# Patient Record
Sex: Female | Born: 1975 | Race: White | Hispanic: No | Marital: Married | Smoking: Never smoker
Health system: Southern US, Community
[De-identification: ages and names within clinical notes are randomized; demographics above are authoritative.]

## PROBLEM LIST (undated history)

## (undated) DIAGNOSIS — M1990 Unspecified osteoarthritis, unspecified site: Secondary | ICD-10-CM

## (undated) DIAGNOSIS — M722 Plantar fascial fibromatosis: Secondary | ICD-10-CM

## (undated) DIAGNOSIS — R000 Tachycardia, unspecified: Secondary | ICD-10-CM

## (undated) DIAGNOSIS — M545 Low back pain, unspecified: Secondary | ICD-10-CM

## (undated) DIAGNOSIS — M2550 Pain in unspecified joint: Secondary | ICD-10-CM

## (undated) DIAGNOSIS — R0981 Nasal congestion: Secondary | ICD-10-CM

## (undated) DIAGNOSIS — G8929 Other chronic pain: Secondary | ICD-10-CM

## (undated) DIAGNOSIS — K219 Gastro-esophageal reflux disease without esophagitis: Secondary | ICD-10-CM

## (undated) DIAGNOSIS — T7840XA Allergy, unspecified, initial encounter: Secondary | ICD-10-CM

## (undated) DIAGNOSIS — R51 Headache: Secondary | ICD-10-CM

## (undated) HISTORY — DX: Plantar fascial fibromatosis: M72.2

## (undated) HISTORY — DX: Nasal congestion: R09.81

## (undated) HISTORY — DX: Other chronic pain: G89.29

## (undated) HISTORY — DX: Unspecified osteoarthritis, unspecified site: M19.90

## (undated) HISTORY — DX: Morbid (severe) obesity due to excess calories: E66.01

## (undated) HISTORY — DX: Gastro-esophageal reflux disease without esophagitis: K21.9

## (undated) HISTORY — DX: Low back pain, unspecified: M54.50

## (undated) HISTORY — DX: Tachycardia, unspecified: R00.0

## (undated) HISTORY — DX: Pain in unspecified joint: M25.50

## (undated) HISTORY — DX: Low back pain: M54.5

## (undated) HISTORY — DX: Allergy, unspecified, initial encounter: T78.40XA

## (undated) HISTORY — PX: LUMBAR DISC SURGERY: SHX700

---

## 2003-07-09 ENCOUNTER — Encounter: Admission: RE | Admit: 2003-07-09 | Discharge: 2003-07-09 | Payer: Self-pay | Admitting: Family Medicine

## 2004-03-15 ENCOUNTER — Inpatient Hospital Stay (HOSPITAL_COMMUNITY): Admission: AD | Admit: 2004-03-15 | Discharge: 2004-03-18 | Payer: Self-pay | Admitting: Obstetrics and Gynecology

## 2004-03-19 ENCOUNTER — Encounter: Admission: RE | Admit: 2004-03-19 | Discharge: 2004-04-18 | Payer: Self-pay | Admitting: Obstetrics and Gynecology

## 2007-04-27 ENCOUNTER — Inpatient Hospital Stay (HOSPITAL_COMMUNITY): Admission: RE | Admit: 2007-04-27 | Discharge: 2007-04-30 | Payer: Self-pay | Admitting: Obstetrics and Gynecology

## 2007-05-03 ENCOUNTER — Encounter: Admission: RE | Admit: 2007-05-03 | Discharge: 2007-06-02 | Payer: Self-pay | Admitting: Obstetrics and Gynecology

## 2007-06-03 ENCOUNTER — Encounter: Admission: RE | Admit: 2007-06-03 | Discharge: 2007-07-02 | Payer: Self-pay | Admitting: Obstetrics and Gynecology

## 2007-07-03 ENCOUNTER — Encounter: Admission: RE | Admit: 2007-07-03 | Discharge: 2007-07-31 | Payer: Self-pay | Admitting: Obstetrics and Gynecology

## 2007-07-11 ENCOUNTER — Ambulatory Visit (HOSPITAL_COMMUNITY): Admission: RE | Admit: 2007-07-11 | Discharge: 2007-07-12 | Payer: Self-pay | Admitting: Specialist

## 2008-07-02 ENCOUNTER — Encounter: Admission: RE | Admit: 2008-07-02 | Discharge: 2008-07-02 | Payer: Self-pay | Admitting: Otolaryngology

## 2008-10-08 ENCOUNTER — Ambulatory Visit: Payer: Self-pay | Admitting: Family Medicine

## 2009-04-08 ENCOUNTER — Encounter (INDEPENDENT_AMBULATORY_CARE_PROVIDER_SITE_OTHER): Payer: Self-pay | Admitting: *Deleted

## 2009-04-08 ENCOUNTER — Encounter: Admission: RE | Admit: 2009-04-08 | Discharge: 2009-04-08 | Payer: Self-pay | Admitting: Otolaryngology

## 2009-05-27 ENCOUNTER — Encounter (INDEPENDENT_AMBULATORY_CARE_PROVIDER_SITE_OTHER): Payer: Self-pay | Admitting: *Deleted

## 2009-06-30 ENCOUNTER — Ambulatory Visit: Payer: Self-pay | Admitting: Gastroenterology

## 2009-06-30 ENCOUNTER — Encounter (INDEPENDENT_AMBULATORY_CARE_PROVIDER_SITE_OTHER): Payer: Self-pay | Admitting: *Deleted

## 2009-06-30 DIAGNOSIS — K3189 Other diseases of stomach and duodenum: Secondary | ICD-10-CM

## 2009-06-30 DIAGNOSIS — R1013 Epigastric pain: Secondary | ICD-10-CM

## 2009-06-30 LAB — CONVERTED CEMR LAB
ALT: 24 units/L (ref 0–35)
AST: 22 units/L (ref 0–37)
Basophils Absolute: 0 10*3/uL (ref 0.0–0.1)
Basophils Relative: 0.4 % (ref 0.0–3.0)
CO2: 31 meq/L (ref 19–32)
Chloride: 104 meq/L (ref 96–112)
Creatinine, Ser: 0.9 mg/dL (ref 0.4–1.2)
Eosinophils Absolute: 0.1 10*3/uL (ref 0.0–0.7)
GFR calc non Af Amer: 76.26 mL/min (ref >60)
HCT: 38.4 % (ref 36.0–46.0)
Hemoglobin: 13.1 g/dL (ref 12.0–15.0)
MCV: 91.3 fL (ref 78.0–100.0)
Neutro Abs: 4.3 10*3/uL (ref 1.4–7.7)
Platelets: 238 10*3/uL (ref 150.0–400.0)
Potassium: 4.6 meq/L (ref 3.5–5.1)
RBC: 4.2 M/uL (ref 3.87–5.11)
RDW: 14.3 % (ref 11.5–14.6)
Sodium: 140 meq/L (ref 135–145)
Total Protein: 7.1 g/dL (ref 6.0–8.3)
WBC: 6.2 10*3/uL (ref 4.5–10.5)

## 2009-07-01 ENCOUNTER — Ambulatory Visit: Payer: Self-pay | Admitting: Gastroenterology

## 2009-07-01 HISTORY — PX: UPPER GASTROINTESTINAL ENDOSCOPY: SHX188

## 2009-07-08 ENCOUNTER — Encounter: Payer: Self-pay | Admitting: Gastroenterology

## 2009-10-02 ENCOUNTER — Ambulatory Visit: Payer: Self-pay | Admitting: Gastroenterology

## 2009-12-21 ENCOUNTER — Encounter: Payer: Self-pay | Admitting: Gastroenterology

## 2010-03-30 NOTE — Assessment & Plan Note (Signed)
Review of gastrointestinal problems: 1. GERD.  Barium esophagram showed significant reflux, 2011.  EGD may 2011 showed patchy gastritis, H. pylori negative, no esophagitis.  Nexium once daily improve symptoms significantly, but not completely.    History of Present Illness Primary GI MD: Rob Bunting MD Primary Meryl Ponder: Elissa Hefty, MD Chief Complaint: 2 month f/u after EGD. Pt still has nausea and epigastric pain.  History of Present Illness:     she is feeling some better about 75% better on proton pump inhibitor once daily..  Takes nexium 1 pill before breakfast daily.  Takes zantac periodically, less than once a week.  Still has a tight heavy feeling in esophagus.  Still has a spasm in throat.           Current Medications (verified): 1)  Percocet 10-650 Mg Tabs (Oxycodone-Acetaminophen) .... Take 1 Tablet By Mouth As Needed Up To Two Times A Day 2)  Zantac 150 Mg Tabs (Ranitidine Hcl) .... Two Times A Day As Needed 3)  Nexium 40 Mg Cpdr (Esomeprazole Magnesium) .Marland Kitchen.. 1 By Mouth Once Daily 20-30 Minutes Before Breakfast 4)  Nasonex 50 Mcg/act Susp (Mometasone Furoate) .... 2 Sprays Every 24 Hours 5)  Mag-Ox 400 400 Mg Tabs (Magnesium Oxide) .... One Tablet By Mouth Once Daily 6)  Omega-3 350 Mg Caps (Omega-3 Fatty Acids) .... 3 Capsules By Mouth Once Daily 7)  Multivitamins   Tabs (Multiple Vitamin) .... 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 alcohol, smoke cigarettes, or drink much caffeine   Vital Signs:  Patient profile:   35 year old female Height:      73 inches Weight:      295.38 pounds BMI:     39.11 Pulse rate:   70 / minute Pulse rhythm:   regular BP sitting:   126 / 68  (right arm) Cuff size:   large  Vitals Entered By: Christie Nottingham CMA Duncan Dull) (October 02, 2009 8:59 AM)  Physical Exam  Additional Exam:  Constitutional:  generally well appearing Psychiatric: alert and oriented times 3 Abdomen: soft, non-tender, non-distended, normal bowel sounds    Impression & Recommendations:  Problem # 1:  GERD her symptoms are better on Nexium daily however not completely resolved and so I would like her to try twice daily Nexium. I did recommend that she try cut back to her 3 months to once daily in each and every other day if possible. She will return to see me on an as-needed basis.  Patient Instructions: 1)  Trial of twice daily nexium.  Do this for 2-3 months, then try  backing off to once daily again.   2)  The medication list was reviewed and reconciled.  All changed / newly prescribed medications were explained.  A complete medication list was provided to the patient / caregiver. Prescriptions: NEXIUM 40 MG CPDR (ESOMEPRAZOLE MAGNESIUM) 1 by mouth once daily 20-30 minutes before breakfast and one before dinner  #60 x 6   Entered and Authorized by:   Rachael Fee MD   Signed by:   Rachael Fee MD on 10/02/2009   Method used:   Electronically to        [facility]* (retail)       [address] [place], [place]  [address]  Ph: [phone]       Fax: [phone]   RxID:   [id number]

## 2010-03-30 NOTE — Letter (Signed)
Summary: New Patient letter  [facility]  [address] [place], [place] [address]   Phone: [phone]  Fax: [phone]       05/27/2009 MRN: [id number]  [name] [address] [place], [place]  [address]  Dear Ms. Vantassell,  Welcome to the Gastroenterology Division at [facility].    You are scheduled to see Dr. Christella Hartigan on 06-30-09 at 8:30a.m. on the 3rd floor at [facility], [address]  We ask that you try to arrive at our office 15 minutes prior to your appointment time to allow for check-in.  We would like you to complete the enclosed self-administered evaluation form prior to your visit and bring it with you on the day of your appointment.  We will review it with you.  Also, please bring a complete list of all your medications or, if you prefer, bring the medication bottles and we will list them.  Please bring your insurance card so that we may make a copy of it.  If your insurance requires a referral to see a specialist, please bring your referral form from your primary care physician.  Co-payments are due at the time of your visit and may be paid by cash, check or credit card.     Your office visit will consist of a consult with your physician (includes a physical exam), any laboratory testing he/she may order, scheduling of any necessary diagnostic testing (e.g. x-ray, ultrasound, CT-scan), and scheduling of a procedure (e.g. Endoscopy, Colonoscopy) if required.  Please allow enough time on your schedule to allow for any/all of these possibilities.    If you cannot keep your appointment, please call [phone] to cancel or reschedule prior to your appointment date.  This allows [place] the opportunity to schedule an appointment for another patient in need of care.  If you do not cancel or reschedule by 5 p.m. the business day prior to your appointment date, you will be charged a $50.00 late cancellation/no-show fee.    Thank you for choosing  [facility] for your medical needs.  We appreciate the opportunity to care for you.  Please visit [place] at our website  to learn more about our practice.                     Sincerely,                                                             The Gastroenterology Division

## 2010-03-30 NOTE — Letter (Signed)
Summary: EGD Instructions  [facility]  [address] [place], [place] [address]   Phone: [phone]  Fax: [phone]       [name]    07/12/1975    MRN: [id number]       Procedure Day /Date:07/01/09     Arrival Time: 3 pm     Procedure Time:4 pm     Location of Procedure:                     [facility] (4th Floor)    PREPARATION FOR ENDOSCOPY   On 07/01/09  THE DAY OF THE PROCEDURE:  1.   No solid foods, milk or milk products are allowed after midnight the night before your procedure.  2.   Do not drink anything colored red or purple.  Avoid juices with pulp.  No orange juice.  3.  You may drink clear liquids until1 pm, which is 2 hours before your procedure.                                                                                                CLEAR LIQUIDS INCLUDE: Water Jello Ice Popsicles Tea (sugar ok, no milk/cream) Powdered fruit flavored drinks Coffee (sugar ok, no milk/cream) Gatorade Juice: apple, white grape, white cranberry  Lemonade Clear bullion, consomm, broth Carbonated beverages (any kind) Strained chicken noodle soup Hard Candy   MEDICATION INSTRUCTIONS  Unless otherwise instructed, you should take regular prescription medications with a small sip of water as early as possible the morning of your procedure.               OTHER INSTRUCTIONS  You will need a responsible adult at least 35 years of age to accompany you and drive you home.   This person must remain in the waiting room during your procedure.  Wear loose fitting clothing that is easily removed.  Leave jewelry and other valuables at home.  However, you may wish to bring a book to read or an iPod/MP3 player to listen to music as you wait for your procedure to start.  Remove all body piercing jewelry and leave at home.  Total time from sign-in until discharge is approximately 2-3 hours.  You should go home directly after your  procedure and rest.  You can resume normal activities the day after your procedure.  The day of your procedure you should not:   Drive   Make legal decisions   Operate machinery   Drink alcohol   Return to work  You will receive specific instructions about eating, activities and medications before you leave.    The above instructions have been reviewed and explained to me by   _______________________    I fully understand and can verbalize these instructions _____________________________ Date _________

## 2010-03-30 NOTE — Procedures (Signed)
Summary: Upper Endoscopy  Patient: [name] Note: All result statuses are Final unless otherwise noted.  Tests: (1) Upper Endoscopy (EGD)   EGD Upper Endoscopy       DONE     [facility]     [address]     [place], [place]  [address]           ENDOSCOPY PROCEDURE REPORT           PATIENT:  [name], [name]  MR#:  [id number]     BIRTHDATE:  [birth year], 33 yrs. old  GENDER:  female     ENDOSCOPIST:  Rachael Fee, MD     Referred by:  Sharlot Gowda, M.D.     PROCEDURE DATE:  07/01/2009     PROCEDURE:  EGD with biopsy     ASA CLASS:  Class II     INDICATIONS:  dyspepsia, GERD related?     MEDICATIONS:  Fentanyl 75 mcg IV, Versed 8 mg IV     TOPICAL ANESTHETIC:  Exactacain Spray           DESCRIPTION OF PROCEDURE:   After the risks benefits and     alternatives of the procedure were thoroughly explained, informed     consent was obtained.  The [facility] [id number] endoscope was     introduced through the mouth and advanced to the second portion of     the duodenum, without limitations.  The instrument was slowly     withdrawn as the mucosa was fully examined.     <<PROCEDUREIMAGES>>     There was mild to moderate, somewhat patchy appearing gastritis.     Biopsies taken for histology, check for H. pylori (see image1 and     image5).  Otherwise the examination was normal (see image4,     image3, and image2).    Retroflexed views revealed no     abnormalities.    The scope was then withdrawn from the patient     and the procedure completed.     COMPLICATIONS:  None           ENDOSCOPIC IMPRESSION:     1) Mild to moderate, patchy gastritis; biopsied     2) Otherwise normal examination           RECOMMENDATIONS:     If biopsies show H. pylori, she will be started on the     appriopriate antibiotics.  If not, then given the UGI barium test     showing signficant regurgitation and ENT evaluation suggesting     LPR, will likely call in prescription for PPI for a  longer trial.           ______________________________     Rachael Fee, MD           n.     eSIGNED:   Rachael Fee at 07/01/2009 03:44 PM           Forrestine Him, [phone]  Note: An exclamation mark (!) indicates a result that was not dispersed into the flowsheet. Document Creation Date: 07/01/2009 3:44 PM _______________________________________________________________________  (1) Order result status: Final Collection or observation date-time: 07/01/2009 15:37 Requested date-time:  Receipt date-time:  Reported date-time:  Referring Physician:   Ordering Physician: Rob Bunting [phone]) Specimen Source:  Source: Launa Grill Order Number: [id number] Lab site:

## 2010-03-30 NOTE — Assessment & Plan Note (Signed)
History of Present Illness Visit Type: Initial Visit Primary GI MD: Rob Bunting MD Primary Provider: Elissa Hefty, MD Chief Complaint: GERD History of Present Illness:     very pleasant 35 year old woman who has had problems with stomach pains.  Epigastric pains, not really nauseas.  The stomach pains were constant throughout the day, but zantac improved it.  Tried prilosec, this worsened her pains.  Tried 20mg  twice a day.  20mg  once a day didn't really change things at all.  food can bring it on.  Stressfull situations can bring the pain on.  Also can bring on globus, laryngeal spasms.  This is impacting her singing.  Saw ENT MD, felt that acid was causing her vocal cord problems.   she had a barium esophagram 2 months ago and this was done to evaluate laryngeal spasms. This showed a hiatal hernia that was small, there was also marked gastroesophageal reflux.           Current Medications (verified): 1)  Percocet 10-650 Mg Tabs (Oxycodone-Acetaminophen) .... Take 1 Tablet By Mouth As Needed Up To Two Times A Day 2)  Zantac 150 Mg Tabs (Ranitidine Hcl) .... 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  All other review of systems was otherwise negative.   Vital Signs:  Patient profile:   35 year old female Height:      73 inches Weight:      292.13 pounds BMI:     38.68 Pulse rate:   72 / minute Pulse rhythm:   regular BP sitting:   110 / 72  (left arm) Cuff size:   regular  Vitals  Entered By: June McMurray CMA Duncan Dull) (Jun 30, 2009 8:38 AM)  Physical Exam  Additional Exam:  Constitutional: generally well appearing Psychiatric: alert and oriented times 3 Eyes: extraocular movements intact Mouth: oropharynx moist, no lesions Neck: supple, no lymphadenopathy Cardiovascular: heart regular rate and rythm Lungs: CTA bilaterally Abdomen: soft, non-tender, non-distended, no obvious ascites, no peritoneal signs, normal bowel sounds Extremities: no lower extremity edema bilaterally Skin: no lesions on visible extremities    Impression & Recommendations:  Problem # 1:  dyspepsia, possibly GERD related certainly there was a lot of reflux noted on a recent barium esophagram. She did not tolerate a trial of Prilosec. Hopefully this is not a classic the fact and I have given her samples of Nexium. We will also arrange for her to have an EGD at her soonest convenience. She has also been given a GERD handout lastly we'll get a basic set of labs including a CBC, complete metabolic profile, celiac sprue testing.  Other Orders: TLB-CBC Platelet - w/Differential (85025-CBCD) TLB-CMP (Comprehensive Metabolic Pnl) (80053-COMP) TLB-IgA (Immunoglobulin A) (82784-IGA) T-Tissue Transglutamase Ab IgA ([address])  Patient Instructions: 1)  You will be scheduled to have an upper endoscopy. 2)  Samples of nexium given (take one pill 20-30 min prior to breakfast meal daily). 3)  You will get lab test(s) done today (cbc, cmet, tTG, total IgA). 4)  GERD handout  given. 5)  The medication list was reviewed and reconciled.  All changed / newly prescribed medications were explained.  A complete medication list was provided to the patient / caregiver.

## 2010-03-30 NOTE — Miscellaneous (Signed)
Summary: Nexium  Clinical Lists Changes  Medications: Added new medication of NEXIUM 40 MG CPDR (ESOMEPRAZOLE MAGNESIUM) 1 by mouth once daily 20-30 minutes before breakfast - Signed Rx of NEXIUM 40 MG CPDR (ESOMEPRAZOLE MAGNESIUM) 1 by mouth once daily 20-30 minutes before breakfast;  #30 x 6;  Signed;  Entered by: Chales Abrahams CMA (AAMA);  Authorized by: Rachael Fee MD;  Method used: Electronically to [facility] [address] [place], [place], [place]  [address], Ph: [phone], Fax: [phone]    Prescriptions: NEXIUM 40 MG CPDR (ESOMEPRAZOLE MAGNESIUM) 1 by mouth once daily 20-30 minutes before breakfast  #30 x 6   Entered by:   Chales Abrahams CMA (AAMA)   Authorized by:   Rachael Fee MD   Signed by:   Chales Abrahams CMA (AAMA) on 07/08/2009   Method used:   Electronically to        [facility]* (retail)       [address] [place], [place]  [address]       Ph: [phone]       Fax: [phone]   RxID:   [id number]   Appended Document: Nexium prior auth sent to [facility]

## 2010-03-30 NOTE — Medication Information (Signed)
Summary: Nexium approved/[employer]  Nexium approved/[employer]   Imported By: Sherian Rein 12/23/2009 09:51:15  _____________________________________________________________________  External Attachment:    Type:   Image     Comment:   External Document

## 2010-05-05 ENCOUNTER — Encounter: Payer: Self-pay | Admitting: Gastroenterology

## 2010-05-05 ENCOUNTER — Ambulatory Visit (INDEPENDENT_AMBULATORY_CARE_PROVIDER_SITE_OTHER): Payer: BC Managed Care – PPO | Admitting: Gastroenterology

## 2010-05-05 ENCOUNTER — Ambulatory Visit (INDEPENDENT_AMBULATORY_CARE_PROVIDER_SITE_OTHER): Payer: BC Managed Care – PPO | Admitting: Family Medicine

## 2010-05-05 DIAGNOSIS — M545 Low back pain: Secondary | ICD-10-CM

## 2010-05-05 DIAGNOSIS — K219 Gastro-esophageal reflux disease without esophagitis: Secondary | ICD-10-CM

## 2010-05-05 DIAGNOSIS — R12 Heartburn: Secondary | ICD-10-CM

## 2010-05-06 ENCOUNTER — Ambulatory Visit: Payer: BC Managed Care – PPO | Admitting: Family Medicine

## 2010-05-11 NOTE — Assessment & Plan Note (Signed)
Review of gastrointestinal problems: 1. GERD.  Barium esophagram showed significant reflux, 2011.  EGD may 2011 showed patchy gastritis, H. pylori negative, no esophagitis.  Nexium once daily improve symptoms significantly, but not completely.    August 2011,  proton pump inhibitor doubled to twice daily    Vital Signs:  Patient profile:   35 year old female Height:      73 inches Weight:      296 pounds BMI:     39.19 BSA:     2.54 Pulse rate:   80 / minute Pulse rhythm:   regular BP sitting:   122 / 84  (left arm)  Vitals Entered By: Merri Ray CMA Duncan Dull) (May 05, 2010 9:13 AM)  Chief Complaint:  gerd.  History of Present Illness:  Will awaken with acid taste in mouth.  can awaken hoarse as well.  takes two times a day ppi before meals and for the past month 300 zantac at bedtime.   on this regimen she is still having fairly classic GERD symptoms.   she is beginning to have low back problems and also plantar fasciitis. She has been recommended to start NSAIDs however she is reluctant given her GI issues.  Current Medications (verified): 1)  Percocet 10-650 Mg Tabs (Oxycodone-Acetaminophen) .... Take 1 Tablet By Mouth As Needed Up To Two Times A Day 2)  Zantac 150 Mg Tabs (Ranitidine Hcl) .... Two Times A Day As Needed 3)  Nexium 40 Mg Cpdr (Esomeprazole Magnesium) .Marland Kitchen.. 1 By Mouth Once Daily 20-30 Minutes Before Breakfast and One Before Dinner 4)  Nasonex 50 Mcg/act Susp (Mometasone Furoate) .... 2 Sprays Every 24 Hours 5)  Mag-Ox 400 400 Mg Tabs (Magnesium Oxide) .... 2  Tablet By Mouth Once Daily 6)  Omega-3 350 Mg Caps (Omega-3 Fatty Acids) .... 3 Capsules By Mouth Once Daily 7)  Multivitamins   Tabs (Multiple Vitamin) .... One Tablet By Mouth Once Daily 8)  Solaraze 3 % Gel (Diclofenac Sodium) .... Every 2 Hours On Foot  Allergies (verified): 1)  ! * Fluticasone  Physical Exam  Additional Exam:  Constitutional: generally well appearing Psychiatric: alert and  oriented times 3 Abdomen: soft, non-tender, non-distended, normal bowel sounds    Impression & Recommendations:  Problem # 1:   GERD  she does have some dyspepsia that may or may not be GERD related, perhaps it is functional. she is somewhat obese and that might contribute. she also has pretty classic GERD symptoms,  pyrosis, acid taste in her mouth, hoarseness in the morning.   these are not well controlled on twice daily proton pump inhibitor taken at the correct time in relation to meals and bedtime H2 blocker. this is generally considered to be maximum medical acid suppression. She does not drink much caffeine, alcohol, she does not smoke cigarettes, she tries to eat meals not within 3 hours of laying down for bed. She generally  sleeps on pillows to help with her acid symptoms.   I think she might benefit from fundoplication. I am going to set her up with a general surgeon to consider this. She might need formal pH testing however to me her symptoms are fairly classic for acid. Esophageal manometry might be needed as well, I will be happy to set that up it the surgeon wishes.  Patient Instructions: 1)  We will set you up to see a general surgeon to consider fundoplication surgery for classic GERD symptoms despite maximum acid suppression (twice daily PPI,  nightly H2 blocker).  You may require esohpageal manometry and pH testing prior to a surgery. 2)  NSAIDs for your back, plantar faciitis are probably safe as long as you continue PPI daily. 3)  A copy of this information will be sent to Dr. Susann Givens. 4)  The medication list was reviewed and reconciled.  All changed / newly prescribed medications were explained.  A complete medication list was provided to the patient / caregiver.  Appended Document: Orders Update/CCS    Clinical Lists Changes  Orders: Added new Test order of [facility] ([facility]) - Signed

## 2010-05-19 ENCOUNTER — Other Ambulatory Visit: Payer: Self-pay | Admitting: Gastroenterology

## 2010-05-19 ENCOUNTER — Telehealth: Payer: Self-pay | Admitting: Gastroenterology

## 2010-05-19 NOTE — Telephone Encounter (Signed)
I placed a call to the pt because Dr Aura Camps office called and stated the pt no showed and did not reschedule.  Left message on machine to call back

## 2010-05-19 NOTE — Telephone Encounter (Signed)
I spoke with the pt and she was rescheduled she was not informed of the appt and was not aware she had one today.  06/02/10 2 pm arrival.

## 2010-06-07 ENCOUNTER — Telehealth: Payer: Self-pay | Admitting: Gastroenterology

## 2010-06-08 NOTE — Telephone Encounter (Signed)
Pt advised that as soon as Dr Christella Hartigan reviews the orders from the surgeon the test will be scheduled and I will call as soon as done.  The pt would like the appt on the 18th her day off.

## 2010-06-13 ENCOUNTER — Telehealth: Payer: Self-pay | Admitting: Gastroenterology

## 2010-06-13 DIAGNOSIS — K219 Gastro-esophageal reflux disease without esophagitis: Secondary | ICD-10-CM

## 2010-06-13 DIAGNOSIS — R1013 Epigastric pain: Secondary | ICD-10-CM

## 2010-06-13 NOTE — Telephone Encounter (Signed)
[name], can you please schedule her for 48 hour bravo, placed by EGD at [facility].  Hospital week or an EUS Thursday.  Also she needs esophageal manometry testing at [facility].  These are for GERD. The pH testing needs to be done while she is completely OFF PPI, h2 blockers for 7 days prior to the test.  thanks

## 2010-06-15 NOTE — Telephone Encounter (Signed)
Pt aware of the Manometry and 48 hour probe appt and was instructed and the instructions have also been mailed.

## 2010-06-21 ENCOUNTER — Telehealth: Payer: Self-pay | Admitting: Gastroenterology

## 2010-06-21 NOTE — Telephone Encounter (Signed)
Pt would like to change her 48 hour probe to the 10th or 21st of May.  I will call the hospital and get that changed and call her back.  Appt has been changed to the 10th pt aware and reinstructed

## 2010-07-01 ENCOUNTER — Encounter: Payer: BC Managed Care – PPO | Admitting: Gastroenterology

## 2010-07-05 ENCOUNTER — Ambulatory Visit (HOSPITAL_COMMUNITY)
Admission: RE | Admit: 2010-07-05 | Discharge: 2010-07-05 | Disposition: A | Discharge status: Home / Self Care | Payer: BC Managed Care – PPO | Source: Ambulatory Visit | Attending: Gastroenterology | Admitting: Gastroenterology

## 2010-07-05 DIAGNOSIS — R1310 Dysphagia, unspecified: Secondary | ICD-10-CM | POA: Insufficient documentation

## 2010-07-08 ENCOUNTER — Encounter: Payer: BC Managed Care – PPO | Admitting: Gastroenterology

## 2010-07-08 ENCOUNTER — Ambulatory Visit (HOSPITAL_COMMUNITY)
Admission: RE | Admit: 2010-07-08 | Discharge: 2010-07-08 | Disposition: A | Discharge status: Home / Self Care | Payer: BC Managed Care – PPO | Source: Ambulatory Visit | Attending: Gastroenterology | Admitting: Gastroenterology

## 2010-07-08 DIAGNOSIS — T182XXA Foreign body in stomach, initial encounter: Secondary | ICD-10-CM | POA: Insufficient documentation

## 2010-07-08 DIAGNOSIS — IMO0002 Reserved for concepts with insufficient information to code with codable children: Secondary | ICD-10-CM | POA: Insufficient documentation

## 2010-07-08 DIAGNOSIS — K311 Adult hypertrophic pyloric stenosis: Secondary | ICD-10-CM | POA: Insufficient documentation

## 2010-07-08 DIAGNOSIS — K219 Gastro-esophageal reflux disease without esophagitis: Secondary | ICD-10-CM | POA: Insufficient documentation

## 2010-07-13 NOTE — Op Note (Signed)
[name], [name]              ACCOUNT NO.:  [id number]   MEDICAL RECORD NO.:  [id number]          PATIENT TYPE:  AMB   LOCATION:  DAY                          FACILITY:  [facility]   PHYSICIAN:  Jene Every, M.D.    DATE OF BIRTH:  [birth year]   DATE OF PROCEDURE:  07/11/2007  DATE OF DISCHARGE:                               OPERATIVE REPORT   PREOPERATIVE DIAGNOSIS:  Spinal stenosis, herniated nucleus pulposus L5-  S1, right.   POSTOPERATIVE DIAGNOSIS:  Spinal stenosis, herniated nucleus pulposus L5-  S1, right.   PROCEDURE PERFORMED:  Lateral recess decompression, foraminotomy S1,  microdiskectomy L5-S1.   ANESTHESIA:  General.   BRIEF HISTORY AND INDICATIONS:  A 35 year old with disk degeneration and  a disk herniation, 5 months into pregnancy, was unable to have  surgery.  MRI indicating large disk herniation compressing the S1 nerve root.  Positive neural tension signs, diminished plantar flexion.  She was  indicated for decompression of S1 nerve root by microdiskectomy.  The  risks and benefits were discussed including bleeding, infection, damage  to neurovascular structures, CSF leakage, epidural fibrosis, adjacent  segment disease, need for fusion in the future, anesthetic  complications, etc.   TECHNIQUE:  The patient in supine position, after induction of adequate  anesthesia and 2 grams Kefzol, she was placed prone on the [place]  frame.  All bony prominences were well-padded.  Lumbar region prepped  and draped in the usual sterile fashion.  Two 18 gauge spinal needles  were utilized to localize the L5-S1 interspace confirmed with x-ray.  Incision was made from spinous process of L5 to S1.  Subcutaneous tissue  dissected.  Electrocautery was utilized to achieve hemostasis.  Dorsolumbar fascia identified and divided in line of the skin incision.  Paraspinous muscle elevated from lamina of L5 and S1.  The McCullough  retractor was placed.  Operating microscope was  draped and brought into  the surgical field.  Ligamentum flavum was detached from the cephalad  edge of S1 utilizing straight curette and the caudad edge of L5.  Hemilaminotomies were performed with a 2 mm Kerrison from the caudad  edge of L5 and the cephalad edge of S1 detaching the ligamentum flavum.  Foraminotomy of S1 was performed.  Noted was the S1 nerve root  compressed into the lateral recess, multifactorial.  I decompressed the  lateral recess to the medial border of the pedicle.  There was an  epidural venous plexus noted as well.  The nerve root was gently  mobilized medially.  A focal HNP was noted.  Annulotomy was performed  and a copious portion of disk material was removed from the disk space  with straight and upbiting pituitary, further mobilized with Epstein and  a nerve hook.  A few large fragments were retrieved.  This fully  decompressed the lateral recess and the S1 nerve root.  Nerve root was  free with a least a centimeter of excursion medial to the pedicle  without difficulty.  It was, however, erythematous and edematous.  Hockey stick probe was passed freely down the foramen of S1 and L5  and  beneath the thecal sac to check the axilla and the shoulder.  There was  no residual disk herniation or other pathology compressing the S1 nerve  root or limiting its mobility.  Disk space and the laminotomy was  irrigated with antibiotic irrigation.  Thrombin-soaked Gelfoam was  placed in the laminotomy defect until hemostasis was achieved.  This was  then removed, again irrigated.  Inspection revealed no evidence of CSF  leakage or active bleeding.  Dorsolumbar fascia reapproximated with #1  Vicryl interrupted figure-of-eight sutures.  Subcutaneous tissue  reapproximated with 2-0 Vicryl simple sutures.  Skin was reapproximated  with 4-0 subcuticular Prolene.  The wound reinforced with Steri-Strips.  Sterile dressing applied.  She was placed supine on the hospital bed,   extubated without difficulty and transported to the recovery room  in  satisfactory condition.   The patient tolerated the procedure well with no complications.   ASSISTANT:  Roma Schanz, P.A.      Jene Every, M.D.  Electronically Signed     JB/MEDQ  D:  07/11/2007  T:  07/11/2007  Job:  [id number]

## 2010-07-13 NOTE — Op Note (Signed)
[name], [name]              ACCOUNT NO.:  [id number]   MEDICAL RECORD NO.:  [id number]          PATIENT TYPE:  INP   LOCATION:  9124                          FACILITY:  [facility]   PHYSICIAN:  Lenoard Aden, M.D.DATE OF BIRTH:  1976/01/15   DATE OF PROCEDURE:  04/27/2007  DATE OF DISCHARGE:                               OPERATIVE REPORT   PREOPERATIVE DIAGNOSIS:  A 39-week intrauterine pregnancy, previous C-  section, for repeat.   POSTOPERATIVE DIAGNOSIS:  A 39-week intrauterine pregnancy, previous C-  section, for repeat.   PROCEDURE:  Repeat low segment transverse cesarean section.   SURGEON:  Lenoard Aden, M.D.   ASSISTANT:  Marlinda Mike, C.N.M.   ANESTHESIA:  Spinal.   ESTIMATED BLOOD LOSS:  1000 mL.   COMPLICATIONS:  None.   DRAINS:  Foley.   COUNTS:  Correct.   The patient went to the recovery room in good condition.   FINDINGS:  Full-term living female, occiput anterior position, vacuum-  assisted x1.  Apgars 8 and 9.  Cord blood collected.  Placenta delivered  manually, intact.      Lenoard Aden, M.D.  Electronically Signed     RJT/MEDQ  D:  04/27/2007  T:  04/28/2007  Job:  [id number]

## 2010-07-13 NOTE — Op Note (Signed)
[name], [name]              ACCOUNT NO.:  [id number]   MEDICAL RECORD NO.:  [id number]          PATIENT TYPE:  INP   LOCATION:  9124                          FACILITY:  [facility]   PHYSICIAN:  Lenoard Aden, M.D.DATE OF BIRTH:  Jul 13, 1975   DATE OF PROCEDURE:  04/27/2007  DATE OF DISCHARGE:                               OPERATIVE REPORT   RE-DICTATION:   PREOPERATIVE DIAGNOSIS:  Previous C-section at 39 weeks, for repeat.   POSTOPERATIVE DIAGNOSIS:  Previous C-section at 39 weeks, for repeat.   PROCEDURE:  Repeat low segment transverse cesarean section.   SURGEON.:  Lenoard Aden, M.D.   ASSISTANT:  Marlinda Mike, C.N.M.   ANESTHESIA:  Spinal.   ESTIMATED BLOOD LOSS:  1000 mL.   COMPLICATIONS:  None.   DRAINS:  Foley.   COUNTS:  Correct.   The patient went to the recovery room in good condition.   FINDINGS:  Full-term living female, occiput anterior position, vacuum-  assisted delivery.  Apgars 8 and 9.  Pediatricians inn attendance.  Placenta posterior location, manually, intact, three-vessel cord noted.  Two-layer uterine closure.  Normal tubes, normal ovaries.   BRIEF OPERATIVE NOTE:  After being apprised of the risks of anesthesia,  infection, bleeding, injury to intraabdominal organs, need for repair  delayed versus immediate complications to include bowel and bladder  injury, the patient was brought to the operating room, where she was  administered a spinal anesthetic without complications, prepped and  draped in the usual sterile fashion.  Foley catheter was placed.  After  achieving adequate anesthesia, dilute Marcaine solution placed.  Pfannenstiel skin incision was made with a scalpel and carried down to  the fascia, which was nicked in the midline and extended transversely  using Mayo scissors.  Rectus muscles were dissected sharply in the  midline.  Peritoneum entered sharply.  Bladder blade placed.  Visceral  peritoneum scored sharply off the  lower uterine segment.  Kerr  hysterotomy incision made.  Atraumatic delivery of full-term living female  with vacuum assistance with one pull, handed to the pediatricians.  Apgars 8 and 9.  Cord blood collected.  Placenta delivered manually,  intact, three-vessel cord noted.  Uterus exteriorized, curetted using a  dry lap pack, and closed in two running imbricating layers of 0 Monocryl  suture.  Good hemostasis noted.  Bladder flap inspected, found to be  hemostatic.  Irrigation accomplished.  Good hemostasis noted.  Fascia  closed using 0 Monocryl suture in  a running fashion.  Subcutaneous tissue reapproximated using 2-0 plain  in a continuous running fashion.  Skin closed using staples.  The  patient tolerates the procedure well and was transferred to the recovery  room in good condition.      Lenoard Aden, M.D.  Electronically Signed     RJT/MEDQ  D:  04/27/2007  T:  04/28/2007  Job:  [id number]

## 2010-07-13 NOTE — Discharge Summary (Signed)
NAMEMARGAREE, SANDHU              ACCOUNT NO.:  [id number]   MEDICAL RECORD NO.:  [id number]          PATIENT TYPE:  INP   LOCATION:  9124                          FACILITY:  [facility]   PHYSICIAN:  Lenoard Aden, M.D.DATE OF BIRTH:  01/08/1976   DATE OF ADMISSION:  04/27/2007  DATE OF DISCHARGE:  04/30/2007                               DISCHARGE SUMMARY   The patient underwent uncomplicated repeat low-segment cesarean section  on April 27, 2007.   Postoperative course uncomplicated.  Tolerated regular diet well.   Discharged to home on day 3.  Discharge teaching done.  Follow up in the  office in 4-6 weeks.      Lenoard Aden, M.D.  Electronically Signed     RJT/MEDQ  D:  06/17/2007  T:  06/17/2007  Job:  [id number]

## 2010-07-16 NOTE — H&P (Signed)
[name], [name]              ACCOUNT NO.:  [id number]   MEDICAL RECORD NO.:  [id number]          PATIENT TYPE:  INP   LOCATION:  9170                          FACILITY:  [facility]   PHYSICIAN:  Lenoard Aden, M.D.DATE OF BIRTH:  Mar 17, 1975   DATE OF ADMISSION:  03/15/2004  DATE OF DISCHARGE:                                HISTORY & PHYSICAL   CHIEF COMPLAINT:  Gestational hypertension at term.   HISTORY OF PRESENT ILLNESS:  The patient is a 35 year old white female G1,  P0 with EDD of March 21, 2004 at [redacted] weeks gestation with elevated blood  pressure, trace proteinuria, and a favorable cervix for induction.  She has  no known drug allergies.  Medications are prenatal vitamins.   FAMILY HISTORY:  Hypertension, heart disease, diabetes.   PAST MEDICAL HISTORY:  Patient with a personal history of depression,  currently on no medications.   PAST OBSTETRICAL AND GYNECOLOGICAL HISTORY:  Negative.   PRENATAL LABORATORY DATA:  GBS negative, blood type O+, Rubella immune,  hepatitis nonreactive, HIV declined, triple screen within normal limits.  The patient most recently had an ultrasound performed on March 04, 2004,  consistent with a 7 pound 10 ounce gestation and a normal AFI.  She also had  a normal ultrasound with normal anatomical survey at 20 weeks and a normal  targeted ultrasound at [facility] due to poor visualization of  anatomical structures.   PHYSICAL EXAMINATION:  GENERAL:  She is a well-developed, well-nourished  white female in no acute distress.  HEENT:  Normal.  LUNGS:  Lungs clear.  HEART:  Regular rate and rhythm.  ABDOMEN:  Soft, gravid, nontender.  Estimated fetal weight by Thayer Ohm is 8-  1/2 to 9 pounds.  PELVIC:  Cervix is 4 cm, 100%, vertex, and 0 station.  EXTREMITIES:  No cords.  NEUROLOGICAL:  Nonfocal.  Deep tendon reflexes 2+ without evidence of  clonus.   IMPRESSION:  1.  Thirty-nine week obstetrics.  2.  Gestational hypertension.   PLAN:  Check PIH panel.  Admit.  Pitocin induction anticipated.  Attempts at  vaginal delivery.     Rich   RJT/MEDQ  D:  03/15/2004  T:  03/15/2004  Job:  [id number]   cc:   [facility]

## 2010-07-20 ENCOUNTER — Telehealth: Payer: Self-pay | Admitting: Gastroenterology

## 2010-07-20 NOTE — Telephone Encounter (Signed)
Records faxed to Dr Daphine Deutscher and pt aware

## 2010-07-20 NOTE — Telephone Encounter (Signed)
Please call her:   Esophageal manometry was essentially normal, pH testing (off meds) clearly showed she does have pathologic amount of acid refluxing into her esophagus.  Please forward a copy of this note to her surgeon as well (Dr. Daphine Deutscher, they are consider antireflux or bariatric surgery.    thanks

## 2010-07-30 ENCOUNTER — Encounter (INDEPENDENT_AMBULATORY_CARE_PROVIDER_SITE_OTHER): Payer: Self-pay | Admitting: General Surgery

## 2010-08-20 ENCOUNTER — Encounter (INDEPENDENT_AMBULATORY_CARE_PROVIDER_SITE_OTHER): Payer: Self-pay | Admitting: Surgery

## 2010-08-25 ENCOUNTER — Encounter (INDEPENDENT_AMBULATORY_CARE_PROVIDER_SITE_OTHER): Payer: Self-pay | Admitting: General Surgery

## 2010-08-26 ENCOUNTER — Encounter (INDEPENDENT_AMBULATORY_CARE_PROVIDER_SITE_OTHER): Payer: Self-pay | Admitting: Surgery

## 2010-09-02 ENCOUNTER — Other Ambulatory Visit: Payer: Self-pay | Admitting: Specialist

## 2010-09-02 ENCOUNTER — Ambulatory Visit (HOSPITAL_COMMUNITY)
Admission: RE | Admit: 2010-09-02 | Discharge: 2010-09-02 | Disposition: A | Discharge status: Home / Self Care | Payer: BC Managed Care – PPO | Source: Ambulatory Visit | Attending: Specialist | Admitting: Specialist

## 2010-09-02 ENCOUNTER — Ambulatory Visit (INDEPENDENT_AMBULATORY_CARE_PROVIDER_SITE_OTHER): Payer: BC Managed Care – PPO | Admitting: Family Medicine

## 2010-09-02 ENCOUNTER — Encounter (HOSPITAL_COMMUNITY): Payer: BC Managed Care – PPO

## 2010-09-02 ENCOUNTER — Encounter: Payer: Self-pay | Admitting: Family Medicine

## 2010-09-02 ENCOUNTER — Other Ambulatory Visit (HOSPITAL_COMMUNITY): Payer: Self-pay | Admitting: Specialist

## 2010-09-02 VITALS — BP 112/70 | HR 70 | Ht 73.0 in | Wt 287.0 lb

## 2010-09-02 DIAGNOSIS — K219 Gastro-esophageal reflux disease without esophagitis: Secondary | ICD-10-CM

## 2010-09-02 DIAGNOSIS — M5126 Other intervertebral disc displacement, lumbar region: Secondary | ICD-10-CM | POA: Insufficient documentation

## 2010-09-02 DIAGNOSIS — Z139 Encounter for screening, unspecified: Secondary | ICD-10-CM

## 2010-09-02 DIAGNOSIS — Z01818 Encounter for other preprocedural examination: Secondary | ICD-10-CM | POA: Insufficient documentation

## 2010-09-02 DIAGNOSIS — R109 Unspecified abdominal pain: Secondary | ICD-10-CM

## 2010-09-02 DIAGNOSIS — Z01812 Encounter for preprocedural laboratory examination: Secondary | ICD-10-CM | POA: Insufficient documentation

## 2010-09-02 DIAGNOSIS — Z01810 Encounter for preprocedural cardiovascular examination: Secondary | ICD-10-CM | POA: Insufficient documentation

## 2010-09-02 DIAGNOSIS — M549 Dorsalgia, unspecified: Secondary | ICD-10-CM

## 2010-09-02 DIAGNOSIS — Z0000 Encounter for general adult medical examination without abnormal findings: Secondary | ICD-10-CM

## 2010-09-02 LAB — POCT URINALYSIS DIPSTICK
Glucose, UA: NEGATIVE
Ketones, UA: NEGATIVE
Protein, UA: NEGATIVE
Spec Grav, UA: 1.01

## 2010-09-02 LAB — URINALYSIS, ROUTINE W REFLEX MICROSCOPIC
Bilirubin Urine: NEGATIVE
Hgb urine dipstick: NEGATIVE
Ketones, ur: NEGATIVE mg/dL
Nitrite: NEGATIVE
Specific Gravity, Urine: 1.009 (ref 1.005–1.030)
Urobilinogen, UA: 0.2 mg/dL (ref 0.0–1.0)

## 2010-09-02 LAB — COMPREHENSIVE METABOLIC PANEL
Albumin: 3.4 g/dL — ABNORMAL LOW (ref 3.5–5.2)
Alkaline Phosphatase: 107 U/L (ref 39–117)
BUN: 13 mg/dL (ref 6–23)
CO2: 28 mEq/L (ref 19–32)
GFR calc Af Amer: >60 mL/min (ref >60)
Glucose, Bld: 97 mg/dL (ref 70–99)
Potassium: 4 mEq/L (ref 3.5–5.1)
Sodium: 139 mEq/L (ref 135–145)

## 2010-09-02 LAB — CBC
HCT: 37.7 % (ref 36.0–46.0)
Hemoglobin: 11.8 g/dL — ABNORMAL LOW (ref 12.0–15.0)
MCHC: 31.3 g/dL (ref 30.0–36.0)
MCV: 92.6 fL (ref 78.0–100.0)
Platelets: 222 10*3/uL (ref 150–400)

## 2010-09-02 LAB — DIFFERENTIAL
Eosinophils Absolute: 0.1 10*3/uL (ref 0.0–0.7)
Lymphocytes Relative: 23 % (ref 12–46)
Neutrophils Relative %: 70 % (ref 43–77)

## 2010-09-02 LAB — PROTIME-INR: Prothrombin Time: 13.4 seconds (ref 11.6–15.2)

## 2010-09-02 LAB — HCG, SERUM, QUALITATIVE: Preg, Serum: NEGATIVE

## 2010-09-02 NOTE — Patient Instructions (Signed)
We will call you with the results of the blood work and to the gallbladder function tests.

## 2010-09-02 NOTE — Progress Notes (Signed)
  Subjective:    Patient ID: [name], female    DOB: [birth year], 35 y.o.   MRN: [id number]  HPI She is here for a complete examination. She has had difficulty recently with back pain who presently is scheduled for surgery. The notes from the with the surgeon were reviewed. She presently is taking morphine to help with her pain. She is also taking gabapentin however is not totally happy with this although it is helping with some of her discomfort. She is also taking gabapentin to help with the neuropathic pain associated with a back pain. She also has a long history of difficulty with reflux symptoms and is followed by Dr. Christella Hartigan. She has been on a PPI as well as H2 blockers without totally from her symptoms. Apparently at some point in the past she did have blood work done which did show questionable hepatitis however repeat studies showed no evidence of this. She is apparently being considered for a stomach banding procedure however she is trying to avoid this. In preparation for her surgery she has had a CBC and cmet drawn.   Review of Systems Negative except as above    Objective:   Physical Exam BP 112/70  Pulse 70  Ht 6\' 1"  (1.854 m)  Wt 287 lb (130.182 kg)  BMI 37.86 kg/m2  LMP 07/23/2010  General Appearance:    Alert, cooperative, no distress, appears stated age  Head:    Normocephalic, without obvious abnormality, atraumatic  Eyes:    PERRL, conjunctiva/corneas clear, EOM's intact, fundi    benign  Ears:    Normal TM's and external ear canals  Nose:   Nares normal, mucosa normal, no drainage or sinus   tenderness  Throat:   Lips, mucosa, and tongue normal; teeth and gums normal  Neck:   Supple, no lymphadenopathy;  thyroid:  no   enlargement/tenderness/nodules; no carotid   bruit or JVD  Back:    Spine nontender, no curvature, ROM normal, no CVA     tenderness  Lungs:     Clear to auscultation bilaterally without wheezes, rales or     ronchi; respirations unlabored  Chest  Wall:    No tenderness or deformity   Heart:    Regular rate and rhythm, S1 and S2 normal, no murmur, rub   or gallop  Breast Exam:    Deferred to GYN  Abdomen:     Soft, non-tender, nondistended, normoactive bowel sounds,    no masses, no hepatosplenomegaly  Genitalia:    Deferred to GYN     Extremities:   No clubbing, cyanosis or edema  Pulses:   2+ and symmetric all extremities  Skin:   Skin color, texture, turgor normal, no rashes or lesions  Lymph nodes:   Cervical, supraclavicular, and axillary nodes normal  Neurologic:   CNII-XII intact, normal strength, sensation and gait; reflexes 2+ and symmetric throughout          Psych:   Normal mood, affect, hygiene and grooming.           Assessment & Plan:  Back pain. Abdominal pain. Reflux. Neuropathic pain. Questionable history of hepatitis. GERD I will check hepatitis B and C. Discussed the possibility of switching to amitriptyline consider gabapentin. She will continue on her pain management. I will also check a HIDA scan

## 2010-09-03 ENCOUNTER — Telehealth: Payer: Self-pay

## 2010-09-03 ENCOUNTER — Encounter (INDEPENDENT_AMBULATORY_CARE_PROVIDER_SITE_OTHER): Payer: Self-pay | Admitting: Surgery

## 2010-09-03 LAB — HEPATITIS C ANTIBODY: HCV Ab: REACTIVE — AB

## 2010-09-03 LAB — HEPATITIS B SURFACE ANTIBODY,QUALITATIVE: Hep B S Ab: NEGATIVE

## 2010-09-03 NOTE — Telephone Encounter (Signed)
Called pt told her of results pt made appt for 7 11

## 2010-09-06 ENCOUNTER — Encounter: Payer: Self-pay | Admitting: Family Medicine

## 2010-09-08 ENCOUNTER — Ambulatory Visit (INDEPENDENT_AMBULATORY_CARE_PROVIDER_SITE_OTHER): Payer: BC Managed Care – PPO | Admitting: Family Medicine

## 2010-09-08 DIAGNOSIS — R768 Other specified abnormal immunological findings in serum: Secondary | ICD-10-CM

## 2010-09-08 DIAGNOSIS — R894 Abnormal immunological findings in specimens from other organs, systems and tissues: Secondary | ICD-10-CM

## 2010-09-08 NOTE — Patient Instructions (Signed)
I will call you with results of this test when it comes in

## 2010-09-08 NOTE — Progress Notes (Signed)
  Subjective:    Patient ID: [name], female    DOB: July 31, 1975, 35 y.o.   MRN: [id number]  HPI She is here for consult concerning recent blood work which did show hepatitis C. She has a previous history of apparently one test being positive over the next being negative. Her risk factor is 4 tattoos. She has not had blood or blood products. She does not use illicit drugs.   Review of Systems     Objective:   Physical Exam Alert and in no distress otherwise not examined       Assessment & Plan:  Hepatitis C.   I discussed hepatitis C in regard to how she obtained it, spread of this, therapy is. I will draw genotype and do viral load. I will probably then refer to the hepatitis C clinic.

## 2010-09-09 ENCOUNTER — Ambulatory Visit (HOSPITAL_COMMUNITY)
Admission: RE | Admit: 2010-09-09 | Discharge: 2010-09-11 | DRG: 758 | Disposition: A | Discharge status: Home / Self Care | Payer: BC Managed Care – PPO | Source: Ambulatory Visit | Attending: Specialist | Admitting: Specialist

## 2010-09-09 ENCOUNTER — Inpatient Hospital Stay (HOSPITAL_COMMUNITY): Payer: BC Managed Care – PPO

## 2010-09-09 ENCOUNTER — Other Ambulatory Visit: Payer: Self-pay | Admitting: Specialist

## 2010-09-09 DIAGNOSIS — M6289 Other specified disorders of muscle: Secondary | ICD-10-CM | POA: Insufficient documentation

## 2010-09-09 DIAGNOSIS — Z01810 Encounter for preprocedural cardiovascular examination: Secondary | ICD-10-CM | POA: Insufficient documentation

## 2010-09-09 DIAGNOSIS — Z01812 Encounter for preprocedural laboratory examination: Secondary | ICD-10-CM | POA: Insufficient documentation

## 2010-09-09 DIAGNOSIS — M5126 Other intervertebral disc displacement, lumbar region: Principal | ICD-10-CM | POA: Insufficient documentation

## 2010-09-09 DIAGNOSIS — K219 Gastro-esophageal reflux disease without esophagitis: Secondary | ICD-10-CM | POA: Insufficient documentation

## 2010-09-09 LAB — HEPATITIS C RNA QUANTITATIVE

## 2010-09-10 LAB — HEPATITIS C GENOTYPE

## 2010-09-12 NOTE — Op Note (Signed)
NAMEARNELLA, [name]              ACCOUNT NO.:  [id number]  MEDICAL RECORD NO.:  [id number]  LOCATION:  0002                         FACILITY:  [facility]  PHYSICIAN:  Jene Every, M.D.    DATE OF BIRTH:  Jul 05, 1975  DATE OF PROCEDURE:  09/09/2010 DATE OF DISCHARGE:                              OPERATIVE REPORT   PREOPERATIVE DIAGNOSIS:  Recurrent disk herniation, spinal stenosis L5- S1 right, morbid obesity.  POSTOPERATIVE DIAGNOSIS:  Recurrent disk herniation, spinal stenosis L5- S1 right, morbid obesity.  PROCEDURE PERFORMED: 1. Redo lumbar decompression, L5-S1, right with foraminotomies at L5-     S1. 2. Redo microdiskectomy at L5-S1.  ANESTHESIA:  General.  ASSISTANT:  Georges Lynch. Darrelyn Hillock, M.D.  OPERATIVE PROCEDURE:  We had difficulty elevating due to the patient's obesity, the patient is 6 feet 1 inches and 290 pounds.  BRIEF HISTORY:  The patient is 35, recurrent disk herniation at L5-S1, especially in S1-S5 root.  Disk generation of L5-S1 and some back pain as well.  She has done well.  Previous diskectomy indicated redo diskectomy.  I have discussed risks, benefits, bleeding, infection, damage to neurovascular structures, CSF leakage, epidural fibrosis, adjacent segment disease, need for fusion in future, persistent back pain, DVT, PE, anesthetic complications etc.  TECHNIQUE:  The patient in supine position, after administration of adequate anesthesia, 2 g of Kefzol and vancomycin to her bursa, placed prone on the [place] frame.  All bony prominence well padded.  Extra care was taken to position the patient due to her size.  All pads were well padded.  Lumbar region prepped and draped in the usual sterile fashion.  We made an incision, surgically excised previous scar tissue. Subcutaneous tissue was dissected and electrocautery was utilized to achieve hemostasis.  We had actually used deep cerebellar retractors and self-retaining retractors due to the significant  subcutaneous adipose tissue.  We identified the dorsolumbar fascia, divided in line with skin incision.  Paraspinous muscle elevated from lamina of L5-S1.  We placed extra long McCullough retractors.  Operating microscope was draped well on the surgical field.  We identified a previous hemilaminotomy, placed Penfield 4 and confirmed it by x-ray.  Then, skeletonized the previous laminotomy.  We enlarged it with a hemilaminotomy at the caudad edge of 5 and cephalad edge of S1 after identifying mobilized epidural fibrosis with a straight microcurette.  I performed copious foraminotomy of S1. Preserved the pars at 5.  We mobilized the epidural fibrosis, identified the pedicle of S1.  Performed a foraminotomy of 5.  We used a micro- osteotome to enlarge the laminotomy, preserving the pars.  We had difficulty with the patient again with depth of the operative site, however with multiple re-positions, we were able to gain adequate visualization.  Bipolar electrocautery was utilized to achieve hemostasis.  After mobilizing the fibrosis medially, performed a foraminotomy off the pedicle of S1.  WE identified the disk space and the disk herniation.  This was mobilized with a nerve hook and micro pituitary.  Multiples fragments were then removed from the disk space. On the MRI, there was a posterior disk herniation.  We removed multiple fragments.  These were sent to pathology.  We entered  disk space with a micro pituitary.  It was fairly narrow.  We  did not curette the endplates.  I further mobilized the disk space with an Epstein preserving the endplates both medially and laterally and any herniated disk material was removed.  I then passed a hockey stick probe out of the foramen S1 and 5.  Thy were widely patent.  Inspection revealed no CSF leakage or active bleeding.  No residual compression upon the 5 or the S1 nerve root.  Next, disk space copiously irrigated with antibiotic irrigation.   I removed the deep McCullough retractors, inspected paraspinous muscles. No evidence of active bleeding.  Copiously irrigated the wound. Prepared dorsolumbar fascia with 1 Vicryl interrupted figure-of-eight sutures, subcu in multiple layers with 2-0 Vicryl simple sutures.  Skin was reapproximated with 4-0 subcuticular Prolene.  Wound reinforced with Steri-Strips.  Sterile dressing applied.  Placed supine on hospital bed, extubated without difficulty, transported to recovery in satisfactory condition.  The patient tolerated the procedure well with no complications.  Blood loss was minimal.     Jene Every, M.D.     JB/MEDQ  D:  09/09/2010  T:  09/09/2010  Job:  [id number]  Electronically Signed by Jene Every M.D. on 09/12/2010 09:53:44 AM

## 2010-09-14 ENCOUNTER — Other Ambulatory Visit (HOSPITAL_COMMUNITY): Payer: BC Managed Care – PPO

## 2010-09-26 NOTE — Discharge Summary (Signed)
  [name], [name]              ACCOUNT NO.:  [id number]  MEDICAL RECORD NO.:  [id number]  LOCATION:  1518                         FACILITY:  [facility]  PHYSICIAN:  Jene Every, M.D.    DATE OF BIRTH:  March 16, 1975  DATE OF ADMISSION:  09/09/2010 DATE OF DISCHARGE:  09/11/2010                              DISCHARGE SUMMARY   ADMISSION DIAGNOSES:  Recurrent disk herniation, spinal stenosis L5-S1 on the right.  DISCHARGE DIAGNOSES:  Recurrent disk herniation, spinal stenosis L5-S1 on the right.  PROCEDURE:  The patient was taken to the OR and underwent redo lumbar decompression L5-S1 on the right with microdiskectomy.  SURGEON:  Jene Every, MD  ASSISTANT:  Georges Lynch. Gioffre, MD  ANESTHESIA:  General.  COMPLICATIONS:  None.  CONSULTS:  PT/OT, Care Management.  Hospital course was uneventful.  The patient remained in the hospital 2 days, then was discharged home without any incident.  DISCHARGE INFORMATION:  She is to follow up with Dr. Shelle Iron in 10-14 days for suture removal.  ACTIVITY:  She is to walk as tolerated utilizing back precautions.  MEDICATIONS:  As per med rec sheet.  DIET:  As tolerated.  CONDITION ON DISCHARGE:  Stable.  FINAL DIAGNOSIS:  Doing well status post redo decompression microdiskectomy at L5-S1.     Roma Schanz, P.A.   ______________________________ Jene Every, M.D.    CS/MEDQ  D:  09/23/2010  T:  09/24/2010  Job:  [id number]  Electronically Signed by Roma Schanz P.A. on 09/24/2010 12:19:05 PM Electronically Signed by Jene Every M.D. on 09/26/2010 09:50:00 AM

## 2010-10-18 ENCOUNTER — Telehealth: Payer: Self-pay | Admitting: Family Medicine

## 2010-10-18 MED ORDER — MOMETASONE FUROATE 50 MCG/ACT NA SUSP: 2.0000 | Freq: Every day | NASAL | Status: DC

## 2010-10-18 NOTE — Telephone Encounter (Signed)
Nasonex renweed

## 2010-11-19 LAB — CBC
Hemoglobin: 11.3 — ABNORMAL LOW
MCV: 95.2
Platelets: 224
RBC: 3.45 — ABNORMAL LOW
RDW: 13.7
RDW: 13.8
WBC: 12.4 — ABNORMAL HIGH

## 2010-11-19 LAB — RPR: RPR Ser Ql: NONREACTIVE

## 2011-01-07 ENCOUNTER — Encounter: Payer: Self-pay | Admitting: Family Medicine

## 2011-01-07 ENCOUNTER — Ambulatory Visit (INDEPENDENT_AMBULATORY_CARE_PROVIDER_SITE_OTHER): Payer: BC Managed Care – PPO | Admitting: Family Medicine

## 2011-01-07 VITALS — BP 112/70 | HR 60 | Wt 299.0 lb

## 2011-01-07 DIAGNOSIS — R42 Dizziness and giddiness: Secondary | ICD-10-CM

## 2011-01-07 DIAGNOSIS — J301 Allergic rhinitis due to pollen: Secondary | ICD-10-CM

## 2011-01-07 MED ORDER — MONTELUKAST SODIUM 10 MG PO TABS: 10.0000 mg | ORAL_TABLET | Freq: Every day | ORAL | Status: DC

## 2011-01-07 NOTE — Patient Instructions (Signed)
Call me in 2 weeks and let me know how you are doing. Go ahead and use Allegra.

## 2011-01-07 NOTE — Progress Notes (Signed)
  Subjective:    Patient ID: [name], female    DOB: 04/26/1975, 35 y.o.   MRN: [id number]  HPI She has a four-month history of difficulty with sinus pressure as well as dizziness. She has a previous history of difficulty with dizziness when she was in high school. She also said issues with chronic sinusitis and was on clindamycin for 4 months. She is having no fever, chills, purulent drainage. She has used Claritin in the past and was on it for 2 weeks but no improvement in her symptoms.   Review of Systems     Objective:   Physical Exam alert and in no distress. Tympanic membranes and canals are normal. Throat is clear. Tonsils are normal. Neck is supple without adenopathy or thyromegaly. Cardiac exam shows a regular sinus rhythm without murmurs or gallops. Lungs are clear to auscultation. Nasal mucosa is red with nontender sinuses.        Assessment & Plan:  Allergic rhinitis with vertigo I will add angular and recommend she use Allegra. She will call me in 2 weeks

## 2011-01-12 ENCOUNTER — Telehealth: Payer: Self-pay | Admitting: Family Medicine

## 2011-01-12 NOTE — Telephone Encounter (Signed)
DONE

## 2011-02-07 ENCOUNTER — Telehealth: Payer: Self-pay

## 2011-02-07 NOTE — Telephone Encounter (Signed)
Pt called and said allergey med works some still having dizzy spells having congestion and headaches would like to know where to go from here

## 2011-02-07 NOTE — Telephone Encounter (Signed)
Patient is to come in for reevaluation and possibly more aggressive management. No call needed

## 2011-04-05 ENCOUNTER — Other Ambulatory Visit: Payer: Self-pay | Admitting: Gastroenterology

## 2011-05-12 ENCOUNTER — Ambulatory Visit: Payer: BC Managed Care – PPO | Admitting: Family Medicine

## 2011-05-18 ENCOUNTER — Ambulatory Visit
Admission: RE | Admit: 2011-05-18 | Discharge: 2011-05-18 | Disposition: A | Discharge status: Home / Self Care | Payer: BC Managed Care – PPO | Source: Ambulatory Visit | Attending: Family Medicine | Admitting: Family Medicine

## 2011-05-18 ENCOUNTER — Encounter: Payer: Self-pay | Admitting: Family Medicine

## 2011-05-18 ENCOUNTER — Other Ambulatory Visit: Payer: Self-pay

## 2011-05-18 ENCOUNTER — Ambulatory Visit (INDEPENDENT_AMBULATORY_CARE_PROVIDER_SITE_OTHER): Payer: BC Managed Care – PPO | Admitting: Family Medicine

## 2011-05-18 VITALS — BP 130/80 | HR 78 | Temp 98.5°F | Ht 73.0 in | Wt 299.0 lb

## 2011-05-18 DIAGNOSIS — J329 Chronic sinusitis, unspecified: Secondary | ICD-10-CM

## 2011-05-18 DIAGNOSIS — J309 Allergic rhinitis, unspecified: Secondary | ICD-10-CM

## 2011-05-18 DIAGNOSIS — M549 Dorsalgia, unspecified: Secondary | ICD-10-CM

## 2011-05-18 DIAGNOSIS — G8929 Other chronic pain: Secondary | ICD-10-CM

## 2011-05-18 MED ORDER — PREDNISONE 10 MG PO KIT: 1.0000 | PACK | Freq: Every day | ORAL | Status: DC

## 2011-05-18 MED ORDER — FLUCONAZOLE 150 MG PO TABS: 150.0000 mg | ORAL_TABLET | ORAL | Status: AC

## 2011-05-18 MED ORDER — AMOXICILLIN-POT CLAVULANATE 875-125 MG PO TABS: 1.0000 | ORAL_TABLET | Freq: Two times a day (BID) | ORAL | Status: AC

## 2011-05-18 NOTE — Progress Notes (Signed)
  Subjective:    Patient ID: [name], female    DOB: 1975/05/13, 36 y.o.   MRN: [id number]  HPI She has a long history of difficulty with sinus disease. Presently she is on a nasal steroid as well as Singulair and an OTC antihistamine. She continues to complain of facial pain/headache as well as some intermittent purulent postnasal drainage. Apparently in the past she had been seen by ENT and placed on antibiotics for an extended period of time which did help. She also continues to have difficulty with chronic back pain and is being followed by a specialist. Apparently he is in the process of changing her regimen around to get her off the more potent pain medications.   Review of Systems     Objective:   Physical Exam alert and in no distress. Tympanic membranes and canals are normal. Throat is clear. Tonsils are normal. Neck is supple without adenopathy or thyromegaly. Cardiac exam shows a regular sinus rhythm without murmurs or gallops. Lungs are clear to auscultation. Nasal mucosa is red with some tenderness over sinuses       Assessment & Plan:   1. Allergic rhinitis, cause unspecified    2. Chronic sinusitis  amoxicillin-clavulanate (AUGMENTIN) 875-125 MG per tablet, fluconazole (DIFLUCAN) 150 MG tablet, CT Maxillofacial LTD WO CM, DISCONTINUED: PredniSONE (STERAPRED DS 12 DAY) 10 MG KIT  3. Chronic back pain     recommend she discuss being placed on Cymbalta by her pain specialist. She is to call me in 2 weeks to let me know how she is doing on the antibiotic and steroids. Diflucan called in to help prevent yeast vaginitis.

## 2011-05-18 NOTE — Patient Instructions (Signed)
Discuss with Dr. Ethelene Hal the possibility of using Cymbalta

## 2011-05-30 ENCOUNTER — Telehealth: Payer: Self-pay | Admitting: Internal Medicine

## 2011-05-30 MED ORDER — AMOXICILLIN-POT CLAVULANATE 875-125 MG PO TABS: 1.0000 | ORAL_TABLET | Freq: Two times a day (BID) | ORAL | Status: DC

## 2011-05-30 NOTE — Telephone Encounter (Signed)
Pt informed

## 2011-05-30 NOTE — Telephone Encounter (Signed)
Augmentin called in for continued difficulty of sinusitis

## 2011-05-30 NOTE — Telephone Encounter (Signed)
N called in Augmentin and have her call me at the end of that course.

## 2011-06-10 ENCOUNTER — Telehealth: Payer: Self-pay | Admitting: Family Medicine

## 2011-06-10 MED ORDER — AMOXICILLIN-POT CLAVULANATE 875-125 MG PO TABS: 1.0000 | ORAL_TABLET | Freq: Two times a day (BID) | ORAL | Status: AC

## 2011-06-10 NOTE — Telephone Encounter (Signed)
Have her continue Nasonex, Singulair and if she's not taking Claritin or Allegra, have her add that to the regimen. Also if she ran out of her antibiotic refill it for her

## 2011-06-10 NOTE — Telephone Encounter (Signed)
Pt called she was getting better on the antibiotics until the last 4 days and she doesn't know if it is allergies.  Terrible sinus pressure, sneezing,watery eyes.  Please advise pt [phone].

## 2011-06-10 NOTE — Telephone Encounter (Signed)
Phoned pt advised of Dr. Jola Babinski directions and refilled Amoxil at [facility].  If not better after 2nd round pt to let us know.

## 2011-06-20 ENCOUNTER — Telehealth: Payer: Self-pay | Admitting: Family Medicine

## 2011-06-20 MED ORDER — SULFAMETHOXAZOLE-TRIMETHOPRIM 800-160 MG PO TABS: 1.0000 | ORAL_TABLET | Freq: Two times a day (BID) | ORAL | Status: DC

## 2011-06-20 NOTE — Telephone Encounter (Signed)
N the Augmentin can cause some stomach distress as well as diarrhea. As long as she continues to improve no other antibiotic will be necessary.

## 2011-06-20 NOTE — Telephone Encounter (Signed)
Pt informed word for word she is still having pressure in face and said she is about 60% better please advise

## 2011-06-20 NOTE — Telephone Encounter (Signed)
Pt called and stated that she believes the augmentin she was put on for a sinus infection is causing stomach cramps and diarrahea. Pt states at this point she wants to stop med.  She also states that her sinus infection is mostly better but not completely.  Please call pt and inform of what she needs to do.

## 2011-06-20 NOTE — Telephone Encounter (Signed)
N that I called in a different antibiotic in for her. Have her call me when she finishes that.

## 2011-06-20 NOTE — Telephone Encounter (Signed)
Pt informed

## 2011-07-01 ENCOUNTER — Telehealth: Payer: Self-pay | Admitting: Family Medicine

## 2011-07-01 MED ORDER — SULFAMETHOXAZOLE-TRIMETHOPRIM 800-160 MG PO TABS: 1.0000 | ORAL_TABLET | Freq: Two times a day (BID) | ORAL | Status: AC

## 2011-07-01 NOTE — Telephone Encounter (Signed)
She states she is 80% better. I discussed this with her and we will give her another 10 days

## 2011-07-12 ENCOUNTER — Telehealth: Payer: Self-pay | Admitting: Internal Medicine

## 2011-07-12 ENCOUNTER — Other Ambulatory Visit: Payer: Self-pay

## 2011-07-12 ENCOUNTER — Encounter: Payer: Self-pay | Admitting: Family Medicine

## 2011-07-12 MED ORDER — PREDNISONE 10 MG PO KIT: 1.0000 | PACK | Freq: Every day | ORAL | Status: DC

## 2011-07-12 NOTE — Telephone Encounter (Signed)
Pt informed

## 2011-07-12 NOTE — Telephone Encounter (Signed)
Let her know that I called in another course of antibiotic and have her call me at the end of this.

## 2011-07-12 NOTE — Telephone Encounter (Signed)
Pharmacy called and needed to know if 21 or 48 and 48 is correct and changed in system

## 2011-07-12 NOTE — Telephone Encounter (Signed)
I will call in another ten-day course of Septra. He is to call me at the end of that period

## 2011-07-13 ENCOUNTER — Telehealth: Payer: Self-pay | Admitting: Family Medicine

## 2011-07-13 NOTE — Telephone Encounter (Signed)
PT CALLED AND STATED THAT SHE IS AT PHARMACY AND IS CONFUSED ABOUT WHAT WAS CALLED IN. PT STATES THAT SHE WAS TOLD PREDNISONE AND SEPTRA WOULD BE CALLED IN.  YOUR NOTES STATE SEPTRA WAS TO BE ORDERED  BUT PREDNISONE WAS SENT IN. PLEASE CALL PT AND INFORM OF WHAT SHE IS TO BE ON.

## 2011-07-14 MED ORDER — SULFAMETHOXAZOLE-TRIMETHOPRIM 800-160 MG PO TABS: 1.0000 | ORAL_TABLET | Freq: Two times a day (BID) | ORAL | Status: AC

## 2011-07-14 MED ORDER — SULFAMETHOXAZOLE-TRIMETHOPRIM 800-160 MG PO TABS: 1.0000 | ORAL_TABLET | Freq: Two times a day (BID) | ORAL | Status: DC

## 2011-07-14 NOTE — Telephone Encounter (Signed)
Called pt to inform her that septra was sent in

## 2011-07-14 NOTE — Telephone Encounter (Signed)
I don't do what happened but was supposed to be Septra not prednisone

## 2011-07-15 ENCOUNTER — Other Ambulatory Visit (INDEPENDENT_AMBULATORY_CARE_PROVIDER_SITE_OTHER): Payer: Self-pay | Admitting: Surgery

## 2011-07-15 ENCOUNTER — Ambulatory Visit (INDEPENDENT_AMBULATORY_CARE_PROVIDER_SITE_OTHER): Payer: BC Managed Care – PPO | Admitting: Surgery

## 2011-07-15 ENCOUNTER — Encounter (INDEPENDENT_AMBULATORY_CARE_PROVIDER_SITE_OTHER): Payer: Self-pay | Admitting: Surgery

## 2011-07-15 DIAGNOSIS — Z1231 Encounter for screening mammogram for malignant neoplasm of breast: Secondary | ICD-10-CM

## 2011-07-15 NOTE — Progress Notes (Signed)
Mr. and Mrs. Hebb came in today we reviewed the chart on lapband with hiatal hernia repair for reflux. There was what she was initially referred for was reflux and incurred by Dr. Leanna Battles. One look at this with an upper GI the wounds facilitate likely need a lapband with hiatal hernia repair. We discussed this and as her initial plan. She's tried many weight loss techniques and wants to get on board with weight loss and repair of her GERD. She had endoscopy by Dr. Maia Breslow in May of 2012 which showed some retained food suggesting some gastric emptying issues. Her DeMeester score was 9.4 using a Bravo capsule. We'll begin the workup for lapband and approval will see her back at the time of scheduling.

## 2011-07-19 ENCOUNTER — Ambulatory Visit (HOSPITAL_BASED_OUTPATIENT_CLINIC_OR_DEPARTMENT_OTHER): Payer: BC Managed Care – PPO | Attending: Surgery | Admitting: Radiology

## 2011-07-19 DIAGNOSIS — G4730 Sleep apnea, unspecified: Secondary | ICD-10-CM | POA: Insufficient documentation

## 2011-07-21 ENCOUNTER — Ambulatory Visit (INDEPENDENT_AMBULATORY_CARE_PROVIDER_SITE_OTHER): Payer: BC Managed Care – PPO | Admitting: Family Medicine

## 2011-07-21 ENCOUNTER — Encounter: Payer: Self-pay | Admitting: Family Medicine

## 2011-07-21 VITALS — BP 120/80 | HR 77 | Wt 299.0 lb

## 2011-07-21 DIAGNOSIS — J329 Chronic sinusitis, unspecified: Secondary | ICD-10-CM

## 2011-07-21 DIAGNOSIS — IMO0001 Reserved for inherently not codable concepts without codable children: Secondary | ICD-10-CM

## 2011-07-21 NOTE — Progress Notes (Signed)
  Subjective:    Patient ID: [name], female    DOB: [birth year], 36 y.o.   MRN: [id number]  HPI She is here for consult. She is scheduled for lap band surgery and part of the requirement is to be off all antibiotics and be tested for H. pylori. She continues on Septra as well as steroids for treatment of presumed sinus disease. CT scan really did not show evidence of this but have been treating her medically for this. Also recently she was exposed to potential rabies. She is up-to-date on all of her immunizations. Review of Systems     Objective:   Physical Exam Alert and in no distress otherwise not examined       Assessment & Plan:  Since she is being scheduled for LAP-BAND surgery, further ENT evaluation to help further elucidate this is warranted. We will also followup with a rabies titer concerning her possible exposure to rabies.

## 2011-07-24 DIAGNOSIS — G4730 Sleep apnea, unspecified: Secondary | ICD-10-CM

## 2011-07-25 NOTE — Procedures (Signed)
NAME:  [name], [name]              ACCOUNT NO.:  [id number]  MEDICAL RECORD NO.:  [id number]          PATIENT TYPE:  OUT  LOCATION:  SLEEP CENTER                 FACILITY:  [facility]  PHYSICIAN:  Jefferie Holston D. Maple Hudson, MD, FCCP, FACPDATE OF BIRTH:  Sep 26, 1975  DATE OF STUDY:  07/19/2011                           NOCTURNAL POLYSOMNOGRAM  REFERRING PHYSICIAN:  Thornton Park. Daphine Deutscher, MD  INDICATION FOR STUDY:  Hypersomnia with sleep apnea.  EPWORTH SLEEPINESS SCORE:  15/24.  BMI 39.6, weight 300 pounds.  Height 73 inches, neck 15 inch.  MEDICATIONS:  Home medications are charted and reviewed.  SLEEP ARCHITECTURE:  Total sleep time 358 minutes with sleep efficiency 85.2%.  Stage I was 5.3%, stage II 76.4%, stage III 0.4%, REM 17.9% of total sleep time.  Sleep latency 34.5 minutes, REM latency 91 minute. Awake after sleep onset at 29.5 minutes.  Arousal index 4.7.  BEDTIME MEDICATION:  Prednisone and Tums.  RESPIRATORY DATA:  Apnea-hypopnea index (AHI) 0.5 per hour.  A total of 3 events were scored as hypopneas while lying supine.  REM AHI 1.9 per hour.  There were insufficient numbers of events to qualify for split CPAP titration protocol on the study night.  OXYGEN DATA:  No significant snoring with oxygen desaturation to a nadir of 91% and mean oxygen saturation through the study of 95.1% on room air.  CARDIAC DATA:  Sinus rhythm with PAC.  MOVEMENT-PARASOMNIA:  No significant movement disturbance.  Bathroom x2.  IMPRESSIONS-RECOMMENDATIONS: 1. Unremarkable sleep architecture for Sleep Center environment.  Rare     respiratory event with sleep disturbance, within normal limits.     Apnea-hypopnea index 0.5 per hour (the normal range for adults is     from 0-5 events per hour).  No significant snoring.  Oxygen     desaturation to a nadir of 91% with mean oxygen saturation through     the study of 95.1% on room air. 2. She slept with head elevated on pillows and reported history of  significant gastroesophageal reflux disease as well as degenerative     disk disease with back pain.     Ethen Bannan D. Maple Hudson, MD, [facility], FACP Diplomate, American Board of Sleep Medicine    CDY/MEDQ  D:  07/24/2011 15:38:48  T:  07/25/2011 02:13:25  Job:  [id number]

## 2011-07-28 ENCOUNTER — Ambulatory Visit (INDEPENDENT_AMBULATORY_CARE_PROVIDER_SITE_OTHER): Payer: BC Managed Care – PPO | Admitting: Surgery

## 2011-08-08 ENCOUNTER — Encounter (HOSPITAL_COMMUNITY)
Admission: RE | Disposition: A | Discharge status: Home / Self Care | Payer: Self-pay | Source: Ambulatory Visit | Attending: Surgery

## 2011-08-08 ENCOUNTER — Ambulatory Visit (HOSPITAL_COMMUNITY)
Admission: RE | Admit: 2011-08-08 | Discharge: 2011-08-08 | Disposition: A | Discharge status: Home / Self Care | Payer: BC Managed Care – PPO | Source: Ambulatory Visit | Attending: Surgery | Admitting: Surgery

## 2011-08-08 DIAGNOSIS — Z01812 Encounter for preprocedural laboratory examination: Secondary | ICD-10-CM | POA: Insufficient documentation

## 2011-08-08 HISTORY — PX: BREATH TEK H PYLORI: SHX5422

## 2011-08-08 SURGERY — BREATH TEST, FOR HELICOBACTER PYLORI

## 2011-08-09 ENCOUNTER — Encounter (HOSPITAL_COMMUNITY): Payer: Self-pay | Admitting: Surgery

## 2011-08-17 ENCOUNTER — Ambulatory Visit (HOSPITAL_COMMUNITY)
Admission: RE | Admit: 2011-08-17 | Discharge: 2011-08-17 | Disposition: A | Discharge status: Home / Self Care | Payer: BC Managed Care – PPO | Source: Ambulatory Visit | Attending: Surgery | Admitting: Surgery

## 2011-08-17 ENCOUNTER — Other Ambulatory Visit (INDEPENDENT_AMBULATORY_CARE_PROVIDER_SITE_OTHER): Payer: Self-pay | Admitting: Surgery

## 2011-08-17 ENCOUNTER — Other Ambulatory Visit: Payer: Self-pay

## 2011-08-17 DIAGNOSIS — Z6839 Body mass index (BMI) 39.0-39.9, adult: Secondary | ICD-10-CM | POA: Insufficient documentation

## 2011-08-17 DIAGNOSIS — R109 Unspecified abdominal pain: Secondary | ICD-10-CM | POA: Insufficient documentation

## 2011-08-17 DIAGNOSIS — Z13820 Encounter for screening for osteoporosis: Secondary | ICD-10-CM | POA: Insufficient documentation

## 2011-08-17 DIAGNOSIS — Z1231 Encounter for screening mammogram for malignant neoplasm of breast: Secondary | ICD-10-CM | POA: Insufficient documentation

## 2011-08-17 DIAGNOSIS — K449 Diaphragmatic hernia without obstruction or gangrene: Secondary | ICD-10-CM | POA: Insufficient documentation

## 2011-08-17 DIAGNOSIS — K219 Gastro-esophageal reflux disease without esophagitis: Secondary | ICD-10-CM | POA: Insufficient documentation

## 2011-08-18 ENCOUNTER — Other Ambulatory Visit (INDEPENDENT_AMBULATORY_CARE_PROVIDER_SITE_OTHER): Payer: Self-pay | Admitting: Surgery

## 2011-08-18 DIAGNOSIS — R928 Other abnormal and inconclusive findings on diagnostic imaging of breast: Secondary | ICD-10-CM

## 2011-08-18 LAB — COMPREHENSIVE METABOLIC PANEL
ALT: 11 U/L (ref 0–35)
AST: 14 U/L (ref 0–37)
Alkaline Phosphatase: 94 U/L (ref 39–117)
BUN: 16 mg/dL (ref 6–23)
Creat: 0.73 mg/dL (ref 0.50–1.10)
Total Bilirubin: 0.2 mg/dL — ABNORMAL LOW (ref 0.3–1.2)

## 2011-08-18 LAB — CBC
HCT: 37.5 % (ref 36.0–46.0)
MCHC: 32.3 g/dL (ref 30.0–36.0)
MCV: 88.9 fL (ref 78.0–100.0)
Platelets: 354 10*3/uL (ref 150–400)
RDW: 14.7 % (ref 11.5–15.5)

## 2011-08-18 LAB — PREGNANCY, URINE: Preg Test, Ur: NEGATIVE

## 2011-08-24 ENCOUNTER — Encounter: Payer: Self-pay | Admitting: *Deleted

## 2011-08-24 ENCOUNTER — Encounter: Payer: BC Managed Care – PPO | Attending: Surgery | Admitting: *Deleted

## 2011-08-24 DIAGNOSIS — Z01818 Encounter for other preprocedural examination: Secondary | ICD-10-CM | POA: Insufficient documentation

## 2011-08-24 DIAGNOSIS — Z713 Dietary counseling and surveillance: Secondary | ICD-10-CM | POA: Insufficient documentation

## 2011-08-24 NOTE — Patient Instructions (Addendum)
   Follow Pre-Op Nutrition Goals to prepare for Lapband Surgery.   Call the [facility] at [phone] once you have been given your surgery date to enrolled in the Pre-Op Nutrition Class. You will need to attend this nutrition class 3-4 weeks prior to your surgery. 

## 2011-08-24 NOTE — Progress Notes (Addendum)
  Pre-Op Assessment Visit:  Pre-Operative LAGB Surgery  Medical Nutrition Therapy:  Appt start time: 0830   End time:  0930.  Patient was seen on 08/24/2011 for Pre-Operative LAGB Nutrition Assessment. Assessment and letter of approval faxed to [facility] Bariatric Surgery Program coordinator on 08/24/2011.  Approval letter sent to [facility] center and will be available in the chart under the media tab.  TANITA  BODY COMP RESULTS  08/24/11   %Fat 53.6%   Fat Mass (lbs) 168.0   Fat Free Mass (lbs) 145.5   Total Body Water (lbs) 106.5   Handouts given during visit include:  Pre-Op Goals   Bariatric Surgery Protein Shakes handout  Patient to call for Pre-Op and Post-Op Nutrition Education at the [facility] when surgery is scheduled.

## 2011-08-25 ENCOUNTER — Ambulatory Visit
Admission: RE | Admit: 2011-08-25 | Discharge: 2011-08-25 | Disposition: A | Discharge status: Home / Self Care | Payer: BC Managed Care – PPO | Source: Ambulatory Visit | Attending: Surgery | Admitting: Surgery

## 2011-08-25 DIAGNOSIS — R928 Other abnormal and inconclusive findings on diagnostic imaging of breast: Secondary | ICD-10-CM

## 2011-09-15 ENCOUNTER — Other Ambulatory Visit: Payer: Self-pay | Admitting: Otolaryngology

## 2011-09-15 ENCOUNTER — Encounter: Payer: BC Managed Care – PPO | Attending: Surgery | Admitting: *Deleted

## 2011-09-15 DIAGNOSIS — Z01818 Encounter for other preprocedural examination: Secondary | ICD-10-CM | POA: Insufficient documentation

## 2011-09-15 DIAGNOSIS — J329 Chronic sinusitis, unspecified: Secondary | ICD-10-CM

## 2011-09-15 DIAGNOSIS — Z713 Dietary counseling and surveillance: Secondary | ICD-10-CM | POA: Insufficient documentation

## 2011-09-16 ENCOUNTER — Ambulatory Visit
Admission: RE | Admit: 2011-09-16 | Discharge: 2011-09-16 | Disposition: A | Discharge status: Home / Self Care | Payer: BC Managed Care – PPO | Source: Ambulatory Visit | Attending: Otolaryngology | Admitting: Otolaryngology

## 2011-09-16 DIAGNOSIS — J329 Chronic sinusitis, unspecified: Secondary | ICD-10-CM

## 2011-09-17 NOTE — Progress Notes (Addendum)
  Bariatric Class:  Appt start time: 0830 end time:  0930.  Pre-Operative Nutrition Class  Patient was seen on 09/15/11 for Pre-Operative Bariatric Surgery Education at [facility].   Surgery date: 10/18/11 Surgery type: LAGB  Samples given per MNT protocol: Bariatric Advantage Multivitamin Lot # [id number] Exp: 09/13  Bariatric Advantage Calcium Citrate Lot # [id number] Exp: 10/13  Celebrate Vitamins Multivitamin Complete Lot # [id number] Exp: 11/14  Celebrate Vitamins Multivitamin Lot # [id number] Exp: 09/14  Celebrate VitaminsCalcium Citrate Lot # [id number] Exp: 10/14  The following the learning objective met by the patient during this course:  Identifies Pre-Op Dietary Goals and will begin 2 weeks pre-operatively  Identifies appropriate sources of fluids and proteins   States protein recommendations and appropriate sources pre and post-operatively  Identifies Post-Operative Dietary Goals and will follow for 2 weeks post-operatively  Identifies appropriate multivitamin and calcium sources  Describes the need for physical activity post-operatively and will follow MD recommendations  States when to call healthcare provider regarding medication questions or post-operative complications  Handouts given during class include:  Pre-Op Bariatric Surgery Diet Handout  Protein Shake Handout  Post-Op Bariatric Surgery Nutrition Handout  BELT Program Information Flyer  Support Group Information Flyer  Follow-Up Plan: Patient will follow-up at [facility] 2 weeks post operatively for diet advancement per MD.

## 2011-09-17 NOTE — Patient Instructions (Addendum)
Follow:   Pre-Op Diet per MD 2 weeks prior to surgery  Phase 2- Liquids (clear/full) 2 weeks after surgery  Vitamin/Mineral/Calcium guidelines for purchasing bariatric supplements  Exercise guidelines pre and post-op per MD  Follow-up at [facility] in 2 weeks post-op for diet advancement. Contact Deby Adger as needed with questions/concerns. 

## 2011-09-22 ENCOUNTER — Telehealth: Payer: Self-pay | Admitting: Family Medicine

## 2011-09-22 MED ORDER — SULFAMETHOXAZOLE-TRIMETHOPRIM 800-160 MG PO TABS: 1.0000 | ORAL_TABLET | Freq: Two times a day (BID) | ORAL | Status: AC

## 2011-09-22 NOTE — Telephone Encounter (Signed)
Let her know that I called in Septra for her.

## 2011-09-22 NOTE — Telephone Encounter (Signed)
PT WAS OFFERED A APPT. SHE STATED THAT SHE HAD ALREADY DONE HER URINE AT HER WORK. SHE ALSO STATED THAT SHE WAS UNABLE TO COME IN. I  ASKED PT TO FAX OVER RESULTS AND I WOULD FORWARD TO YOU. 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 I AM SENDING RESULTS BACK. PT USES [facility] ON [address]. PLEASE INFORM PT.

## 2011-09-22 NOTE — Telephone Encounter (Signed)
Called pt to inform her med was called in left message

## 2011-09-22 NOTE — Telephone Encounter (Signed)
Urinalysis that she did have her office showed 5200 white cells and 5-10 RBCs. I will treat her with Septra.

## 2011-09-27 ENCOUNTER — Encounter: Payer: Self-pay | Admitting: Family Medicine

## 2011-09-29 ENCOUNTER — Ambulatory Visit (INDEPENDENT_AMBULATORY_CARE_PROVIDER_SITE_OTHER): Payer: BC Managed Care – PPO | Admitting: Surgery

## 2011-09-30 NOTE — Progress Notes (Signed)
Patient scheduled for surgery 10/18/11.  Need orders placed in EPIC.  Thank You.

## 2011-10-05 ENCOUNTER — Encounter (HOSPITAL_COMMUNITY): Payer: Self-pay | Admitting: Pharmacy Technician

## 2011-10-12 ENCOUNTER — Encounter (INDEPENDENT_AMBULATORY_CARE_PROVIDER_SITE_OTHER): Payer: Self-pay | Admitting: Surgery

## 2011-10-12 ENCOUNTER — Ambulatory Visit (INDEPENDENT_AMBULATORY_CARE_PROVIDER_SITE_OTHER): Payer: BC Managed Care – PPO | Admitting: Surgery

## 2011-10-12 ENCOUNTER — Other Ambulatory Visit (INDEPENDENT_AMBULATORY_CARE_PROVIDER_SITE_OTHER): Payer: Self-pay | Admitting: Surgery

## 2011-10-12 VITALS — BP 128/80 | HR 68 | Temp 97.6°F | Resp 16 | Ht 73.0 in | Wt 308.0 lb

## 2011-10-12 DIAGNOSIS — K219 Gastro-esophageal reflux disease without esophagitis: Secondary | ICD-10-CM

## 2011-10-12 DIAGNOSIS — E669 Obesity, unspecified: Secondary | ICD-10-CM

## 2011-10-12 NOTE — Progress Notes (Signed)
Chief Complaint:  GERD and obesity   History of Present Illness:  [name] is an 36 y.o. female DVM who has had longstanding GER and obesity.  We discussed a high failure rate with Nissen fundoplication in overweight and decided to proceed with lapband and hiatus hernia repair.  She had initially seen Dr. Derrell Lolling in April of 2012 with gastric subcutaneous reflux disease and morbid obesity. She had a barium study in 2011 showed marked gastroesophageal reflux.  Her recent upper GI did not show a hiatal hernia and was unable to elicit any reflux. She has been maintained on Nexium twice a day. Informed consent has been obtained regarding lab and hiatal hernia repair and indications risk and benefits. Plan to proceed with surgery.  Past Medical History  Diagnosis Date  . Allergy   . GERD (gastroesophageal reflux disease)   . Chronic lower back pain     SCIATIC PAIN  . Arthritis   . Plantar fasciitis   . GERD (gastroesophageal reflux disease)   . Sinus congestion   . Joint pain   . Morbid obesity     Past Surgical History  Procedure Date  . Lumbar disc surgery 06/2004, 08/2010    L5-S, laminectomy/disection  . Upper gastrointestinal endoscopy 07/01/2009    JACOBS MD  . Cesarean section 03/15/04 & 04/27/07  . Breath tek h pylori 08/08/2011    Procedure: BREATH TEK H PYLORI;  Surgeon: Valarie Merino, MD;  Location: [facility];  Service: General;  Laterality: N/A;  715    Current Outpatient Prescriptions  Medication Sig Dispense Refill  . Calcium Carbonate Antacid (TUMS PO) Take 3,000 mg by mouth daily.      Marland Kitchen docusate sodium (COLACE) 100 MG capsule Take 500 mg by mouth at bedtime.       . Magnesium 400 MG CAPS Take 1 capsule by mouth daily.       . mometasone (NASONEX) 50 MCG/ACT nasal spray Place 2 sprays into the nose daily.  17 g  11  . montelukast (SINGULAIR) 10 MG tablet Take 1 tablet (10 mg total) by mouth at bedtime.  30 tablet  11  . NEXIUM 40 MG capsule TAKE ONE CAPSULE  BY MOUTH 20-30 MINUTES BEFORE BREAKFAST AND ONE BEFORE DINNER  30 each  11  . Omega-3 Fatty Acids (OMEGA 3 PO) Take 3 capsules by mouth daily.       Marland Kitchen oxycodone (OXYCONTIN) 30 MG TB12 Take 30 mg by mouth 3 (three) times daily.       . ranitidine (ZANTAC) 300 MG tablet Take 300 mg by mouth at bedtime.       . clindamycin (CLEOCIN) 150 MG capsule       . predniSONE (STERAPRED UNI-PAK) 10 MG tablet       . sulfamethoxazole-trimethoprim (BACTRIM DS) 800-160 MG per tablet       . DISCONTD: gabapentin (NEURONTIN) 300 MG capsule Take 300 mg by mouth 5 (five) times daily.         Fluticasone Family History  Problem Relation Age of Onset  . Depression Mother   . Arthritis Mother   . Diabetes Father   . Hypertension Father   . Arthritis Brother   . Arthritis Maternal Grandmother   . Cancer Maternal Grandmother     cervical  . Heart disease Maternal Grandmother   . Hypertension Maternal Grandmother   . Cancer Maternal Grandfather   . Arthritis Paternal Grandmother   . Cancer Paternal Grandmother   .  Hypertension Paternal Grandfather   . Stroke Paternal Grandfather    Social History:   reports that she has never smoked. She has never used smokeless tobacco. She reports that she drinks alcohol. She reports that she does not use illicit drugs.   REVIEW OF SYSTEMS - PERTINENT POSITIVES ONLY: Neg for DVT  Physical Exam:   Blood pressure 128/80, pulse 68, temperature 97.6 F (36.4 C), temperature source Temporal, resp. rate 16, height 6\' 1"  (1.854 m), weight 308 lb (139.708 kg). Body mass index is 40.64 kg/(m^2).  Gen:  WDWN WF NAD  Neurological: Alert and oriented to person, place, and time. Motor and sensory function is grossly intact  Head: Normocephalic and atraumatic.  Eyes: Conjunctivae are normal. Pupils are equal, round, and reactive to light. No scleral icterus.  Neck: Normal range of motion. Neck supple. No tracheal deviation or thyromegaly present.  Cardiovascular:  SR without  murmurs or gallops.  No carotid bruits Respiratory: Effort normal.  No respiratory distress. No chest wall tenderness. Breath sounds normal.  No wheezes, rales or rhonchi.  Abdomen:  nontender and obese GU: Musculoskeletal: Normal range of motion. Extremities are nontender. No cyanosis, edema or clubbing noted Lymphadenopathy: No cervical, preauricular, postauricular or axillary adenopathy is present Skin: Skin is warm and dry. No rash noted. No diaphoresis. No erythema. No pallor. Pscyh: Normal mood and affect. Behavior is normal. Judgment and thought content normal.   LABORATORY RESULTS: No results found for this or any previous visit (from the past 48 hour(s)).  RADIOLOGY RESULTS: No results found.  Problem List: Patient Active Problem List  Diagnosis  . DYSPEPSIA&OTHER SPEC DISORDERS FUNCTION STOMACH  . GERD (gastroesophageal reflux disease)    Assessment & Plan: Obesity and GERD.   Will plan lapband and hiatus hernia repair.     [name], MD, [facility] [phone] beeper [phone]  10/12/2011 5:37 PM

## 2011-10-12 NOTE — Patient Instructions (Signed)

## 2011-10-13 ENCOUNTER — Encounter (HOSPITAL_COMMUNITY)
Admission: RE | Admit: 2011-10-13 | Discharge: 2011-10-13 | Disposition: A | Discharge status: Home / Self Care | Payer: BC Managed Care – PPO | Source: Ambulatory Visit | Attending: Surgery | Admitting: Surgery

## 2011-10-13 ENCOUNTER — Encounter (HOSPITAL_COMMUNITY): Payer: Self-pay

## 2011-10-13 HISTORY — DX: Headache: R51

## 2011-10-13 LAB — COMPREHENSIVE METABOLIC PANEL
ALT: 13 U/L (ref 0–35)
AST: 19 U/L (ref 0–37)
Alkaline Phosphatase: 85 U/L (ref 39–117)
CO2: 26 mEq/L (ref 19–32)
Calcium: 9.6 mg/dL (ref 8.4–10.5)
Chloride: 103 mEq/L (ref 96–112)
GFR calc non Af Amer: >90 mL/min (ref >90)
Potassium: 3.6 mEq/L (ref 3.5–5.1)
Sodium: 140 mEq/L (ref 135–145)

## 2011-10-13 LAB — CBC
Hemoglobin: 12.5 g/dL (ref 12.0–15.0)
MCHC: 32.3 g/dL (ref 30.0–36.0)
RBC: 4.41 MIL/uL (ref 3.87–5.11)
WBC: 6 10*3/uL (ref 4.0–10.5)

## 2011-10-13 LAB — SURGICAL PCR SCREEN
MRSA, PCR: NEGATIVE
Staphylococcus aureus: NEGATIVE

## 2011-10-13 LAB — HCG, SERUM, QUALITATIVE: Preg, Serum: NEGATIVE

## 2011-10-13 NOTE — Pre-Procedure Instructions (Signed)
Reviewed pre-op instructions using Teach back method.

## 2011-10-13 NOTE — Patient Instructions (Addendum)
20 [name]  10/13/2011   Your procedure is scheduled on:  Tuesday 10/18/2011 at 0715 am  Report to [facility] at 0515 AM.  Call this number if you have problems the morning of surgery: [phone]   Remember:   Do not eat food:After Midnight.  May have clear liquids:until Midnight .    Take these medicines the morning of surgery with A SIP OF WATER: Nexium, Oxycodone    Do not wear jewelry, make-up or nail polish.  Do not wear lotions, powders, or perfumes.  Do not shave 48 hours prior to surgery. Men may shave face and neck.  Do not bring valuables to the hospital.  Contacts, dentures or bridgework may not be worn into surgery.  Leave suitcase in the car. After surgery it may be brought to your room.  For patients admitted to the hospital, checkout time is 11:00 AM the day of discharge.       Special Instructions: CHG Shower Use Special Wash: 1/2 bottle night before surgery and 1/2 bottle morning of surgery.   Please read over the following fact sheets that you were given: MRSA Information, Sleep Apnea Sheet, Incentive Spirometry Sheet                If you have any questions, pleases call me at [phone] [facility].Georgeanna Lea, Charity fundraiser, BSN

## 2011-10-17 MED ORDER — DEXTROSE 5 % IV SOLN
2.0000 g | INTRAVENOUS | Status: DC
  Filled 2011-10-17: qty 2

## 2011-10-17 NOTE — H&P (Addendum)
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 We discussed a high failure rate with Nissen fundoplication in overweight and decided to proceed with lapband and hiatus hernia repair. She had initially seen Dr. Derrell Lolling in April of 2012 with gastric subcutaneous reflux disease and morbid obesity. She had a barium study in 2011 showed marked gastroesophageal reflux.  Her recent upper GI did not show a hiatal hernia and was unable to elicit any reflux. She has been maintained on Nexium twice a day. Informed consent has been obtained regarding lab and hiatal hernia repair and indications risk and benefits. Plan to proceed with surgery.  Past Medical History   Diagnosis  Date   .  Allergy    .  GERD (gastroesophageal reflux disease)    .  Chronic lower back pain      SCIATIC PAIN   .  Arthritis    .  Plantar fasciitis    .  GERD (gastroesophageal reflux disease)    .  Sinus congestion    .  Joint pain    .  Morbid obesity     Past Surgical History   Procedure  Date   .  Lumbar disc surgery  06/2004, 08/2010     L5-S, laminectomy/disection   .  Upper gastrointestinal endoscopy  07/01/2009     JACOBS MD   .  Cesarean section  03/15/04 & 04/27/07   .  Breath tek h pylori  08/08/2011     Procedure: BREATH TEK H PYLORI; Surgeon: Valarie Merino, MD; Location: [facility]; Service: General; Laterality: N/A; 715    Current Outpatient Prescriptions   Medication  Sig  Dispense  Refill   .  Calcium Carbonate Antacid (TUMS PO)  Take 3,000 mg by mouth daily.     Marland Kitchen  docusate sodium (COLACE) 100 MG capsule  Take 500 mg by mouth at bedtime.     .  Magnesium 400 MG CAPS  Take 1 capsule by mouth daily.     .  mometasone (NASONEX) 50 MCG/ACT nasal spray  Place 2 sprays into the nose daily.  17 g  11   .  montelukast (SINGULAIR) 10 MG tablet  Take 1 tablet (10 mg total) by mouth at bedtime.  30 tablet  11   .   NEXIUM 40 MG capsule  TAKE ONE CAPSULE BY MOUTH 20-30 MINUTES BEFORE BREAKFAST AND ONE BEFORE DINNER  30 each  11   .  Omega-3 Fatty Acids (OMEGA 3 PO)  Take 3 capsules by mouth daily.     Marland Kitchen  oxycodone (OXYCONTIN) 30 MG TB12  Take 30 mg by mouth 3 (three) times daily.     .  ranitidine (ZANTAC) 300 MG tablet  Take 300 mg by mouth at bedtime.     .  clindamycin (CLEOCIN) 150 MG capsule      .  predniSONE (STERAPRED UNI-PAK) 10 MG tablet      .  sulfamethoxazole-trimethoprim (BACTRIM DS) 800-160 MG per tablet      .  DISCONTD: gabapentin (NEURONTIN) 300 MG capsule  Take 300 mg by mouth 5 (five) times daily.      Fluticasone  Family History   Problem  Relation  Age of Onset   .  Depression  Mother    .  Arthritis  Mother    .  Diabetes  Father    .  Hypertension  Father    .  Arthritis  Brother    .  Arthritis  Maternal Grandmother    .  Cancer  Maternal Grandmother       cervical    .  Heart disease  Maternal Grandmother    .  Hypertension  Maternal Grandmother    .  Cancer  Maternal Grandfather    .  Arthritis  Paternal Grandmother    .  Cancer  Paternal Grandmother    .  Hypertension  Paternal Grandfather    .  Stroke  Paternal Grandfather     Social History: reports that she has never smoked. She has never used smokeless tobacco. She reports that she drinks alcohol. She reports that she does not use illicit drugs.  REVIEW OF SYSTEMS - PERTINENT POSITIVES ONLY:  Neg for DVT  Physical Exam:  Blood pressure 128/80, pulse 68, temperature 97.6 F (36.4 C), temperature source Temporal, resp. rate 16, height 6\' 1"  (1.854 m), weight 308 lb (139.708 kg).  Body mass index is 40.64 kg/(m^2).  Gen: WDWN WF NAD  Neurological: Alert and oriented to person, place, and time. Motor and sensory function is grossly intact  Head: Normocephalic and atraumatic.  Eyes: Conjunctivae are normal. Pupils are equal, round, and reactive to light. No scleral icterus.  Neck: Normal range of motion. Neck  supple. No tracheal deviation or thyromegaly present.  Cardiovascular: SR without murmurs or gallops. No carotid bruits  Respiratory: Effort normal. No respiratory distress. No chest wall tenderness. Breath sounds normal. No wheezes, rales or rhonchi.  Abdomen: nontender and obese  GU:  Musculoskeletal: Normal range of motion. Extremities are nontender. No cyanosis, edema or clubbing noted Lymphadenopathy: No cervical, preauricular, postauricular or axillary adenopathy is present Skin: Skin is warm and dry. No rash noted. No diaphoresis. No erythema. No pallor. Pscyh: Normal mood and affect. Behavior is normal. Judgment and thought content normal.  LABORATORY RESULTS:  No results found for this or any previous visit (from the past 48 hour(s)).  RADIOLOGY RESULTS:  No results found.  Problem List:  Patient Active Problem List   Diagnosis   .  DYSPEPSIA&OTHER SPEC DISORDERS FUNCTION STOMACH   .  GERD (gastroesophageal reflux disease)    Assessment & Plan:  Obesity and GERD.  Will plan lapband and hiatus hernia repair.  [name], MD, [facility]  [phone] beeper  [phone]  There has been no change in the patient's past medical history or physical exam in the past 24 hours to the best of my knowledge. I examined the patient in the holding area and have made any changes to the history and physical exam report that is included above.   Expectations and outcome results have been discussed with the patient to include risks and benefits.  All questions have been answered and we will proceed with previously discussed procedure noted and signed in the consent form in the patient's record.    Virginio Isidore BMD FACS 7:24 AM  10/18/2011

## 2011-10-18 ENCOUNTER — Encounter (HOSPITAL_COMMUNITY)
Admission: RE | Disposition: A | Discharge status: Home / Self Care | Payer: Self-pay | Source: Ambulatory Visit | Attending: Surgery

## 2011-10-18 ENCOUNTER — Ambulatory Visit (HOSPITAL_COMMUNITY)
Admission: RE | Admit: 2011-10-18 | Discharge: 2011-10-19 | DRG: 288 | Disposition: A | Discharge status: Home / Self Care | Payer: BC Managed Care – PPO | Source: Ambulatory Visit | Attending: Surgery | Admitting: Surgery

## 2011-10-18 ENCOUNTER — Encounter (HOSPITAL_COMMUNITY): Payer: Self-pay | Admitting: *Deleted

## 2011-10-18 ENCOUNTER — Ambulatory Visit (HOSPITAL_COMMUNITY): Payer: BC Managed Care – PPO | Admitting: Anesthesiology

## 2011-10-18 ENCOUNTER — Encounter (HOSPITAL_COMMUNITY): Payer: Self-pay | Admitting: Anesthesiology

## 2011-10-18 DIAGNOSIS — K2100 Gastro-esophageal reflux disease with esophagitis, without bleeding: Secondary | ICD-10-CM

## 2011-10-18 DIAGNOSIS — Z6841 Body Mass Index (BMI) 40.0 and over, adult: Secondary | ICD-10-CM

## 2011-10-18 DIAGNOSIS — K219 Gastro-esophageal reflux disease without esophagitis: Secondary | ICD-10-CM | POA: Diagnosis present

## 2011-10-18 DIAGNOSIS — M129 Arthropathy, unspecified: Secondary | ICD-10-CM | POA: Diagnosis present

## 2011-10-18 DIAGNOSIS — K3189 Other diseases of stomach and duodenum: Secondary | ICD-10-CM | POA: Diagnosis present

## 2011-10-18 DIAGNOSIS — E669 Obesity, unspecified: Secondary | ICD-10-CM

## 2011-10-18 DIAGNOSIS — Z9884 Bariatric surgery status: Secondary | ICD-10-CM

## 2011-10-18 DIAGNOSIS — K449 Diaphragmatic hernia without obstruction or gangrene: Secondary | ICD-10-CM | POA: Diagnosis present

## 2011-10-18 DIAGNOSIS — Z79899 Other long term (current) drug therapy: Secondary | ICD-10-CM

## 2011-10-18 DIAGNOSIS — R1013 Epigastric pain: Secondary | ICD-10-CM | POA: Diagnosis present

## 2011-10-18 DIAGNOSIS — Z01812 Encounter for preprocedural laboratory examination: Secondary | ICD-10-CM | POA: Insufficient documentation

## 2011-10-18 HISTORY — PX: LAPAROSCOPIC GASTRIC BANDING: SHX1100

## 2011-10-18 HISTORY — PX: HIATAL HERNIA REPAIR: SHX195

## 2011-10-18 LAB — CBC
HCT: 40.4 % (ref 36.0–46.0)
MCH: 28.2 pg (ref 26.0–34.0)
MCHC: 31.9 g/dL (ref 30.0–36.0)
MCV: 88.4 fL (ref 78.0–100.0)
RDW: 13.6 % (ref 11.5–15.5)

## 2011-10-18 LAB — PREGNANCY, URINE: Preg Test, Ur: NEGATIVE

## 2011-10-18 SURGERY — GASTRIC BANDING, LAPAROSCOPIC
Anesthesia: General | Site: Abdomen | Wound class: Clean

## 2011-10-18 MED ORDER — ROCURONIUM BROMIDE 100 MG/10ML IV SOLN
INTRAVENOUS | Status: DC | PRN
  Administered 2011-10-18: 25 mg via INTRAVENOUS
  Administered 2011-10-18 (×2): 15 mg via INTRAVENOUS
  Administered 2011-10-18: 5 mg via INTRAVENOUS

## 2011-10-18 MED ORDER — FLUTICASONE PROPIONATE 50 MCG/ACT NA SUSP
1.0000 | Freq: Every day | NASAL | Status: DC
  Filled 2011-10-18: qty 16

## 2011-10-18 MED ORDER — UNJURY CHICKEN SOUP POWDER: 2.0000 [oz_av] | Freq: Four times a day (QID) | ORAL | Status: DC

## 2011-10-18 MED ORDER — DEXTROSE 5 % IV SOLN
3.0000 g | INTRAVENOUS | Status: DC
  Administered 2011-10-18: 2 g via INTRAVENOUS

## 2011-10-18 MED ORDER — FENTANYL CITRATE 0.05 MG/ML IJ SOLN
25.0000 ug | INTRAMUSCULAR | Status: DC | PRN
  Administered 2011-10-18: 50 ug via INTRAVENOUS

## 2011-10-18 MED ORDER — CEFOXITIN SODIUM-DEXTROSE 1-4 GM-% IV SOLR (PREMIX)
INTRAVENOUS | Status: AC
Start: 2011-10-18 — End: 2011-10-18
  Filled 2011-10-18: qty 100

## 2011-10-18 MED ORDER — OXYCODONE HCL 5 MG PO TABS: 5.0000 mg | ORAL_TABLET | Freq: Once | ORAL | Status: DC | PRN

## 2011-10-18 MED ORDER — UNJURY VANILLA POWDER: 2.0000 [oz_av] | Freq: Four times a day (QID) | ORAL | Status: DC

## 2011-10-18 MED ORDER — FENTANYL CITRATE 0.05 MG/ML IJ SOLN
INTRAMUSCULAR | Status: DC | PRN
  Administered 2011-10-18 (×2): 50 ug via INTRAVENOUS
  Administered 2011-10-18: 100 ug via INTRAVENOUS
  Administered 2011-10-18: 50 ug via INTRAVENOUS

## 2011-10-18 MED ORDER — HYDROMORPHONE HCL PF 1 MG/ML IJ SOLN
0.2500 mg | INTRAMUSCULAR | Status: DC | PRN
  Administered 2011-10-18 (×4): 0.5 mg via INTRAVENOUS

## 2011-10-18 MED ORDER — UNJURY CHOCOLATE CLASSIC POWDER: 2.0000 [oz_av] | Freq: Four times a day (QID) | ORAL | Status: DC

## 2011-10-18 MED ORDER — PROMETHAZINE HCL 25 MG/ML IJ SOLN
6.2500 mg | INTRAMUSCULAR | Status: DC | PRN
  Administered 2011-10-18: 12.5 mg via INTRAVENOUS

## 2011-10-18 MED ORDER — SODIUM CHLORIDE 0.9 % IJ SOLN
INTRAMUSCULAR | Status: DC | PRN
  Administered 2011-10-18: 10 mL

## 2011-10-18 MED ORDER — ONDANSETRON HCL 4 MG/2ML IJ SOLN
INTRAMUSCULAR | Status: DC | PRN
  Administered 2011-10-18: 4 mg via INTRAVENOUS

## 2011-10-18 MED ORDER — ACETAMINOPHEN 10 MG/ML IV SOLN
INTRAVENOUS | Status: DC | PRN
  Administered 2011-10-18: 1000 mg via INTRAVENOUS

## 2011-10-18 MED ORDER — METOCLOPRAMIDE HCL 5 MG/ML IJ SOLN
INTRAMUSCULAR | Status: DC | PRN
  Administered 2011-10-18: 10 mg via INTRAVENOUS

## 2011-10-18 MED ORDER — LACTATED RINGERS IV SOLN
INTRAVENOUS | Status: DC | PRN
  Administered 2011-10-18 (×3): via INTRAVENOUS

## 2011-10-18 MED ORDER — HEPARIN SODIUM (PORCINE) 5000 UNIT/ML IJ SOLN
5000.0000 [IU] | Freq: Three times a day (TID) | INTRAMUSCULAR | Status: DC
  Administered 2011-10-18 – 2011-10-19 (×3): 5000 [IU] via SUBCUTANEOUS
  Filled 2011-10-18 (×5): qty 1

## 2011-10-18 MED ORDER — NEOSTIGMINE METHYLSULFATE 1 MG/ML IJ SOLN
INTRAMUSCULAR | Status: DC | PRN
  Administered 2011-10-18: 4 mg via INTRAVENOUS

## 2011-10-18 MED ORDER — FENTANYL CITRATE 0.05 MG/ML IJ SOLN
INTRAMUSCULAR | Status: AC
  Filled 2011-10-18: qty 2

## 2011-10-18 MED ORDER — BUPIVACAINE LIPOSOME 1.3 % IJ SUSP
20.0000 mL | Freq: Once | INTRAMUSCULAR | Status: DC
  Filled 2011-10-18: qty 20

## 2011-10-18 MED ORDER — MORPHINE SULFATE 2 MG/ML IJ SOLN
2.0000 mg | INTRAMUSCULAR | Status: DC | PRN
  Administered 2011-10-18: 6 mg via INTRAVENOUS
  Administered 2011-10-18: 4 mg via INTRAVENOUS
  Administered 2011-10-18: 2 mg via INTRAVENOUS
  Administered 2011-10-19 (×3): 4 mg via INTRAVENOUS
  Administered 2011-10-19: 2 mg via INTRAVENOUS
  Filled 2011-10-18 (×2): qty 2
  Filled 2011-10-18: qty 3
  Filled 2011-10-18 (×2): qty 2
  Filled 2011-10-18: qty 1
  Filled 2011-10-18: qty 2

## 2011-10-18 MED ORDER — DEXTROSE 5 % IV SOLN
2.0000 g | INTRAVENOUS | Status: DC
  Filled 2011-10-18: qty 2

## 2011-10-18 MED ORDER — ACETAMINOPHEN 10 MG/ML IV SOLN
INTRAVENOUS | Status: AC
Start: 2011-10-18 — End: 2011-10-18
  Filled 2011-10-18: qty 100

## 2011-10-18 MED ORDER — OXYCODONE-ACETAMINOPHEN 5-325 MG/5ML PO SOLN
5.0000 mL | ORAL | Status: DC | PRN
  Administered 2011-10-19: 10 mL via ORAL
  Filled 2011-10-18: qty 10

## 2011-10-18 MED ORDER — PROMETHAZINE HCL 25 MG/ML IJ SOLN
INTRAMUSCULAR | Status: AC
  Filled 2011-10-18: qty 1

## 2011-10-18 MED ORDER — HYDROMORPHONE HCL PF 1 MG/ML IJ SOLN
INTRAMUSCULAR | Status: AC
  Filled 2011-10-18: qty 2

## 2011-10-18 MED ORDER — MEPERIDINE HCL 50 MG/ML IJ SOLN: 6.2500 mg | INTRAMUSCULAR | Status: DC | PRN

## 2011-10-18 MED ORDER — ACETAMINOPHEN 160 MG/5ML PO SOLN: 650.0000 mg | ORAL | Status: DC | PRN

## 2011-10-18 MED ORDER — ONDANSETRON HCL 4 MG/2ML IJ SOLN: 4.0000 mg | INTRAMUSCULAR | Status: DC | PRN

## 2011-10-18 MED ORDER — LIDOCAINE HCL (CARDIAC) 20 MG/ML IV SOLN
INTRAVENOUS | Status: DC | PRN
  Administered 2011-10-18: 50 mg via INTRAVENOUS

## 2011-10-18 MED ORDER — LACTATED RINGERS IV SOLN
INTRAVENOUS | Status: DC
  Administered 2011-10-18: 09:00:00 via INTRAVENOUS

## 2011-10-18 MED ORDER — ACETAMINOPHEN 10 MG/ML IV SOLN: 1000.0000 mg | Freq: Once | INTRAVENOUS | Status: DC | PRN

## 2011-10-18 MED ORDER — PROPOFOL 10 MG/ML IV BOLUS
INTRAVENOUS | Status: DC | PRN
  Administered 2011-10-18: 200 mg via INTRAVENOUS

## 2011-10-18 MED ORDER — KETAMINE HCL 10 MG/ML IJ SOLN
INTRAMUSCULAR | Status: DC | PRN
  Administered 2011-10-18: 10 mg via INTRAVENOUS

## 2011-10-18 MED ORDER — BUPIVACAINE LIPOSOME 1.3 % IJ SUSP
INTRAMUSCULAR | Status: DC | PRN
  Administered 2011-10-18: 20 mL

## 2011-10-18 MED ORDER — HYDROMORPHONE HCL PF 1 MG/ML IJ SOLN
INTRAMUSCULAR | Status: DC | PRN
  Administered 2011-10-18 (×4): 0.5 mg via INTRAVENOUS

## 2011-10-18 MED ORDER — KCL IN DEXTROSE-NACL 20-5-0.45 MEQ/L-%-% IV SOLN
INTRAVENOUS | Status: DC
  Administered 2011-10-18 – 2011-10-19 (×2): via INTRAVENOUS
  Filled 2011-10-18 (×3): qty 1000

## 2011-10-18 MED ORDER — HEPARIN SODIUM (PORCINE) 5000 UNIT/ML IJ SOLN
5000.0000 [IU] | INTRAMUSCULAR | Status: AC
  Administered 2011-10-18: 5000 [IU] via SUBCUTANEOUS
  Filled 2011-10-18: qty 1

## 2011-10-18 MED ORDER — GLYCOPYRROLATE 0.2 MG/ML IJ SOLN
INTRAMUSCULAR | Status: DC | PRN
  Administered 2011-10-18: .6 mg via INTRAVENOUS

## 2011-10-18 MED ORDER — MIDAZOLAM HCL 5 MG/5ML IJ SOLN
INTRAMUSCULAR | Status: DC | PRN
  Administered 2011-10-18: 2 mg via INTRAVENOUS

## 2011-10-18 MED ORDER — SUCCINYLCHOLINE CHLORIDE 20 MG/ML IJ SOLN
INTRAMUSCULAR | Status: DC | PRN
  Administered 2011-10-18: 100 mg via INTRAVENOUS

## 2011-10-18 MED ORDER — OXYCODONE HCL 5 MG/5ML PO SOLN
5.0000 mg | Freq: Once | ORAL | Status: DC | PRN
  Filled 2011-10-18: qty 5

## 2011-10-18 MED ORDER — DEXAMETHASONE SODIUM PHOSPHATE 10 MG/ML IJ SOLN
INTRAMUSCULAR | Status: DC | PRN
  Administered 2011-10-18: 5 mg via INTRAVENOUS

## 2011-10-18 MED ORDER — KCL IN DEXTROSE-NACL 20-5-0.45 MEQ/L-%-% IV SOLN
INTRAVENOUS | Status: AC
  Filled 2011-10-18: qty 1000

## 2011-10-18 SURGICAL SUPPLY — 67 items
ADH SKN CLS APL DERMABOND .7 (GAUZE/BANDAGES/DRESSINGS) ×1
APL SKNCLS STERI-STRIP NONHPOA (GAUZE/BANDAGES/DRESSINGS)
BAND LAP 10.0 W/TUBES (Band) ×2 IMPLANT
BENZOIN TINCTURE PRP APPL 2/3 (GAUZE/BANDAGES/DRESSINGS) IMPLANT
BLADE HEX COATED 2.75 (ELECTRODE) ×2 IMPLANT
BLADE SURG 15 STRL LF DISP TIS (BLADE) ×1 IMPLANT
BLADE SURG 15 STRL SS (BLADE) ×2
CANISTER SUCTION 2500CC (MISCELLANEOUS) ×2 IMPLANT
CLOTH BEACON ORANGE TIMEOUT ST (SAFETY) ×2 IMPLANT
COVER SURGICAL LIGHT HANDLE (MISCELLANEOUS) ×2 IMPLANT
DECANTER SPIKE VIAL GLASS SM (MISCELLANEOUS) ×4 IMPLANT
DERMABOND ADVANCED (GAUZE/BANDAGES/DRESSINGS) ×1
DERMABOND ADVANCED .7 DNX12 (GAUZE/BANDAGES/DRESSINGS) IMPLANT
DEVICE SUT QUICK LOAD TK 5 (STAPLE) ×8 IMPLANT
DEVICE SUT TI-KNOT TK 5X26 (MISCELLANEOUS) ×2 IMPLANT
DEVICE SUTURE ENDOST 10MM (ENDOMECHANICALS) ×1 IMPLANT
DISSECTOR BLUNT TIP ENDO 5MM (MISCELLANEOUS) ×1 IMPLANT
DRAPE CAMERA CLOSED 9X96 (DRAPES) ×2 IMPLANT
ELECT REM PT RETURN 9FT ADLT (ELECTROSURGICAL) ×2
ELECTRODE REM PT RTRN 9FT ADLT (ELECTROSURGICAL) ×1 IMPLANT
GLOVE BIOGEL M 8.0 STRL (GLOVE) ×2 IMPLANT
GLOVE BIOGEL PI IND STRL 7.0 (GLOVE) ×1 IMPLANT
GLOVE BIOGEL PI INDICATOR 7.0 (GLOVE) ×1
GOWN STRL NON-REIN LRG LVL3 (GOWN DISPOSABLE) ×2 IMPLANT
GOWN STRL REIN XL XLG (GOWN DISPOSABLE) ×6 IMPLANT
HOVERMATT SINGLE USE (MISCELLANEOUS) ×2 IMPLANT
KIT BASIN OR (CUSTOM PROCEDURE TRAY) ×2 IMPLANT
MESH HERNIA 1X4 RECT BARD (Mesh General) IMPLANT
MESH HERNIA BARD 1X4 (Mesh General) ×1 IMPLANT
NDL SPNL 22GX3.5 QUINCKE BK (NEEDLE) ×1 IMPLANT
NEEDLE SPNL 22GX3.5 QUINCKE BK (NEEDLE) ×2 IMPLANT
NS IRRIG 1000ML POUR BTL (IV SOLUTION) ×2 IMPLANT
PACK UNIVERSAL I (CUSTOM PROCEDURE TRAY) ×2 IMPLANT
PENCIL BUTTON HOLSTER BLD 10FT (ELECTRODE) ×2 IMPLANT
SCALPEL HARMONIC ACE (MISCELLANEOUS) IMPLANT
SET IRRIG TUBING LAPAROSCOPIC (IRRIGATION / IRRIGATOR) IMPLANT
SHEARS CURVED HARMONIC AC 45CM (MISCELLANEOUS) IMPLANT
SLEEVE ADV FIXATION 5X100MM (TROCAR) IMPLANT
SLEEVE Z-THREAD 5X100MM (TROCAR) IMPLANT
SOLUTION ANTI FOG 6CC (MISCELLANEOUS) ×2 IMPLANT
SPONGE GAUZE 4X4 12PLY (GAUZE/BANDAGES/DRESSINGS) ×2 IMPLANT
SPONGE LAP 18X18 X RAY DECT (DISPOSABLE) ×2 IMPLANT
STAPLER VISISTAT 35W (STAPLE) ×2 IMPLANT
STRIP CLOSURE SKIN 1/2X4 (GAUZE/BANDAGES/DRESSINGS) IMPLANT
SUT ETHIBOND 2 0 SH (SUTURE) ×8
SUT ETHIBOND 2 0 SH 36X2 (SUTURE) ×3 IMPLANT
SUT PROLENE 2 0 CT2 30 (SUTURE) ×2 IMPLANT
SUT SILK 0 (SUTURE) ×2
SUT SILK 0 30XBRD TIE 6 (SUTURE) IMPLANT
SUT SURGIDAC NAB ES-9 0 48 120 (SUTURE) ×1 IMPLANT
SUT VIC AB 2-0 SH 27 (SUTURE) ×2
SUT VIC AB 2-0 SH 27X BRD (SUTURE) IMPLANT
SUT VIC AB 4-0 SH 18 (SUTURE) ×2 IMPLANT
SYR 20CC LL (SYRINGE) ×2 IMPLANT
SYR 30ML LL (SYRINGE) ×2 IMPLANT
SYS KII OPTICAL ACCESS 15MM (TROCAR) ×2
SYSTEM KII OPTICAL ACCESS 15MM (TROCAR) ×1 IMPLANT
TOWEL OR 17X26 10 PK STRL BLUE (TOWEL DISPOSABLE) ×5 IMPLANT
TROCAR ADV FIXATION 11X100MM (TROCAR) IMPLANT
TROCAR ADV FIXATION 5X100MM (TROCAR) ×1 IMPLANT
TROCAR XCEL NON-BLD 11X100MML (ENDOMECHANICALS) ×2 IMPLANT
TROCAR Z-THREAD FIOS 11X100 BL (TROCAR) ×1 IMPLANT
TROCAR Z-THREAD FIOS 12X100MM (TROCAR) ×1 IMPLANT
TROCAR Z-THREAD FIOS 5X100MM (TROCAR) ×3 IMPLANT
TROCAR Z-THREAD SLEEVE 11X100 (TROCAR) ×1 IMPLANT
TUBE CALIBRATION LAPBAND (TUBING) ×2 IMPLANT
TUBING INSUFFLATION 10FT LAP (TUBING) ×2 IMPLANT

## 2011-10-18 NOTE — Transfer of Care (Signed)
Immediate Anesthesia Transfer of Care Note  Patient: [name]  Procedure(s) Performed: Procedure(s) (LRB): LAPAROSCOPIC GASTRIC BANDING (N/A) LAPAROSCOPIC REPAIR OF HIATAL HERNIA (N/A)  Patient Location: PACU  Anesthesia Type: General  Level of Consciousness: alert , oriented and patient cooperative  Airway & Oxygen Therapy: Patient Spontanous Breathing and Patient connected to face mask oxygen  Post-op Assessment: Report given to PACU RN, Post -op Vital signs reviewed and stable and Patient moving all extremities  Post vital signs: Reviewed and stable  Complications: No apparent anesthesia complications

## 2011-10-18 NOTE — Anesthesia Postprocedure Evaluation (Signed)
Anesthesia Post Note  Patient: [name]  Procedure(s) Performed: Procedure(s) (LRB): LAPAROSCOPIC GASTRIC BANDING (N/A) LAPAROSCOPIC REPAIR OF HIATAL HERNIA (N/A)  Anesthesia type: General  Patient location: PACU  Post pain: Pain level controlled  Post assessment: Post-op Vital signs reviewed  Last Vitals: BP 135/81  Pulse 79  Temp 36.5 C (Oral)  Resp 14  Ht 6\' 1"  (1.854 m)  Wt 305 lb (138.347 kg)  BMI 40.24 kg/m2  SpO2 99%  LMP 09/30/2011  Post vital signs: Reviewed  Level of consciousness: sedated  Complications: No apparent anesthesia complications

## 2011-10-18 NOTE — Op Note (Signed)
10/18/2011  Surgeon: Wenda Low, MD, FACS Asst:  Gaynelle Adu, MD, FACS  Procedure: Laparoscopic adjustable gastric banding with APS and posterior hiatal hernia repair (1 suture)  Anes:  General  EBL:  Minimal  Description of Procedure  The patient was taken to OR # 11 and given general anesthesia.  After a prep with PCMX the patient was draped and a timeout performed.  Access to the abdomen was achieved with a 0 degree Optiview technique through the left upper quadrant.    Adhesions were minimal.  Ports were placed to the the right of the midline including a 15 trocar in  the right upper quadrant placed obliquely.  The Satira Mccallum was used to retract the left lateral segment and the peritoneum was incised along the left crus.   The EJ junction as assessed for a hiatus hernia and a small dimple noted.  A balloon test was positive at 10 cc of air in the balloon. The EG junction was dissected from the right side and this revealed the posterior right and left crus and the vagus nerve.  A single suture was placed to approximate the crua posteriorally and secured with a Ty Knot.  The repeat balloon test showed snugging of the hiatus.  The phreno gastric attachments were maintained on the left side.     The pars flaccida technique was utilized to insert the blunt "finger " dissector from right to left behind the stomach.  This created a target zone to pass the band passer.     The lapband APS  Had been previously flushed and was inserted through the 15 trocar.  It was placed in the tip of "the finger"  and pulled around behind the stomach.   The band was plicated with a Super stitch laterally and 2 sutures to the small gastric pouch.  The band had a good lie.  The tubing was brought out through the lower incision on the right and connected to the port which had mesh sewn onto the back and was placed into the subcutaneous pocket.  The incisions were injected with Exparel and closed with 4-0 vicryl  and Dermabond.     The patient was taken to the PACU in stable condition.    [name], MD, [facility], [place] [phone]

## 2011-10-18 NOTE — Anesthesia Preprocedure Evaluation (Addendum)
Anesthesia Evaluation  Patient identified by MRN, date of birth, ID band Patient awake    Reviewed: Allergy & Precautions, H&P , NPO status , Patient's Chart, lab work & pertinent test results  Airway Mallampati: II TM Distance: >3 FB Neck ROM: Full    Dental  (+) Teeth Intact and Dental Advisory Given   Pulmonary  breath sounds clear to auscultation  Pulmonary exam normal       Cardiovascular Rhythm:Regular Rate:Normal     Neuro/Psych  Headaches,    GI/Hepatic GERD-  ,  Endo/Other    Renal/GU      Musculoskeletal   Abdominal (+) + obese,  Abdomen: soft.    Peds  Hematology   Anesthesia Other Findings   Reproductive/Obstetrics                         Anesthesia Physical Anesthesia Plan  ASA: III  Anesthesia Plan: General   Post-op Pain Management:    Induction: Intravenous  Airway Management Planned: LMA  Additional Equipment:   Intra-op Plan:   Post-operative Plan: Extubation in OR  Informed Consent: I have reviewed the patients History and Physical, chart, labs and discussed the procedure including the risks, benefits and alternatives for the proposed anesthesia with the patient or authorized representative who has indicated his/her understanding and acceptance.   Dental advisory given  Plan Discussed with: CRNA and Surgeon  Anesthesia Plan Comments:        Anesthesia Quick Evaluation

## 2011-10-19 ENCOUNTER — Inpatient Hospital Stay (HOSPITAL_COMMUNITY): Payer: BC Managed Care – PPO

## 2011-10-19 DIAGNOSIS — E669 Obesity, unspecified: Secondary | ICD-10-CM

## 2011-10-19 DIAGNOSIS — Z9884 Bariatric surgery status: Secondary | ICD-10-CM

## 2011-10-19 LAB — CBC WITH DIFFERENTIAL/PLATELET
Basophils Relative: 0 % (ref 0–1)
Eosinophils Absolute: 0.1 10*3/uL (ref 0.0–0.7)
HCT: 36.3 % (ref 36.0–46.0)
Hemoglobin: 11.5 g/dL — ABNORMAL LOW (ref 12.0–15.0)
MCH: 27.9 pg (ref 26.0–34.0)
MCHC: 31.7 g/dL (ref 30.0–36.0)
MCV: 88.1 fL (ref 78.0–100.0)
Monocytes Absolute: 0.6 10*3/uL (ref 0.1–1.0)
Neutro Abs: 5.5 10*3/uL (ref 1.7–7.7)

## 2011-10-19 MED ORDER — OXYCODONE-ACETAMINOPHEN 5-325 MG/5ML PO SOLN: 7.5000 mL | ORAL | Status: DC | PRN

## 2011-10-19 MED ORDER — IOHEXOL 300 MG/ML  SOLN: 20.0000 mL | Freq: Once | INTRAMUSCULAR | Status: DC | PRN

## 2011-10-19 NOTE — Discharge Summary (Signed)
Physician Discharge Summary  Patient ID: [name] MRN: [id number] DOB/AGE: Mar 13, 1975 36 y.o.  Admit date: 10/18/2011 Discharge date: 10/19/2011  Admission Diagnoses:  Obesity and GERD  Discharge Diagnoses:  same  Active Problems:  Obesity  Lapband APS August 2013 with post hiatus hernia repair   Surgery:  lapband APS with 1 suture posterior closure of the hiatus  Discharged Condition: improved  Hospital Course:   Had surgery. UGI on PD 1 looked OK  Consults: none  Significant Diagnostic Studies: UGI    Discharge Exam: Blood pressure 114/72, pulse 60, temperature 98.3 F (36.8 C), temperature source Oral, resp. rate 20, height 6\' 1"  (1.854 m), weight 305 lb (138.347 kg), last menstrual period 09/30/2011, SpO2 98.00%. Minimal abdominal pain  Disposition: 01-Home or Self Care  Discharge Orders    Future Appointments: Provider: Department: Dept Phone: Center:   11/01/2011 4:00 PM [facility] [phone] NDM   11/02/2011 3:30 PM Valarie Merino, MD [facility] [phone] None     Future Orders Please Complete By Expires   Diet Carb Modified      Increase activity slowly      Discharge instructions      Comments:   Follow bariatric dietary instructions   No dressing needed      Call MD for:  persistant nausea and vomiting        Medication List  As of 10/19/2011 12:42 PM   STOP taking these medications         TUMS PO         TAKE these medications         clindamycin 150 MG capsule   Commonly known as: CLEOCIN      docusate sodium 100 MG capsule   Commonly known as: COLACE   Take 500 mg by mouth at bedtime.      Magnesium 400 MG Caps   Take 1 capsule by mouth daily.      mometasone 50 MCG/ACT nasal spray   Commonly known as: NASONEX   Place 2 sprays into the nose daily.      montelukast 10 MG tablet   Commonly known as: SINGULAIR   Take 1 tablet (10 mg total) by mouth at bedtime.      NEXIUM 40 MG  capsule   Generic drug: esomeprazole   TAKE ONE CAPSULE BY MOUTH 20-30 MINUTES BEFORE BREAKFAST AND ONE BEFORE DINNER      OMEGA 3 PO   Take 3 capsules by mouth daily.      oxycodone 30 MG Tb12   Commonly known as: OXYCONTIN   Take 30 mg by mouth 3 (three) times daily.      oxyCODONE-acetaminophen 5-325 MG/5ML solution   Commonly known as: ROXICET   Take 7.5 mLs by mouth every 4 (four) hours as needed.      predniSONE 10 MG tablet   Commonly known as: STERAPRED UNI-PAK      ranitidine 300 MG tablet   Commonly known as: ZANTAC   Take 300 mg by mouth at bedtime.      sulfamethoxazole-trimethoprim 800-160 MG per tablet   Commonly known as: BACTRIM DS           Follow-up Information    Follow up with Valarie Merino, MD.   Contact information:   [address] [place] [address] [phone]          Signed: Valarie Merino 10/19/2011, 12:42 PM

## 2011-10-19 NOTE — Progress Notes (Signed)
UGI results called to Dr. Daphine Deutscher; orders received to begin POD #1 diet; Ellyle, RN notified of results and orders. Talmadge Chad, RN Bariatric Nurse Coordinator

## 2011-10-19 NOTE — Progress Notes (Signed)
Pt alert and oriented; VSS; just returned from UGI; tolerating water well; burping a little; denies flatus or BM at this time; voiding without difficulty; ambulating without difficulty; c/o abdominal with some relief from prn meds; pt has chronic pain issues; pt already has follow up appts with [facility] and [facility]; using incentive; aware of support group and BELT program; discharge instructions reviewed and pt verbalized understanding of. ADJUSTABLE GASTRIC BAND DISCHARGE INSTRUCTIONS  Drs. Fredrik Rigger, Hoxworth, Wilson, and Teller Call if you have any problems.   Call [phone] and ask for the surgeon on call.    If you need immediate assistance come to the ER at [facility]. Tell the ER personnel that you are a new post-op gastric banding patient. Signs and symptoms to report:   Severe vomiting or nausea. If you cannot tolerate clear liquids for longer than 1 day, you need to call your surgeon.    Abdominal pain which does not get better after taking your pain medication   Fever greater than 101 F degree   Difficulty breathing   Chest pain    Redness, swelling, drainage, or foul odor at incision sites    If your incisions open or pull apart   Swelling or pain in calf (lower leg)   Diarrhea, frequent watery, uncontrolled bowel movements.   Constipation, (no bowel movements for 3 days) if this occurs, Take Milk of Magnesia, 2 tablespoons by mouth, 3 times a day for 2 days if needed.  Call your doctor if constipation continues. Stop taking Milk of Magnesia once you have had a bowel movement. You may also use Miralax according to the label instructions.   Anything you consider "abnormal for you".   Normal side effects after Surgery:   Unable to sleep at night or concentrate   Irritability   Being tearful (crying) or depressed   These are common complaints, possibly related to your anesthesia, stress of surgery and change in lifestyle, that usually go away a few weeks after surgery.  If these  feelings continue, call your medical doctor.  Wound Care You may have surgical glue, steri-strips, or staples over your incisions after surgery.  Surgical glue:  Looks like a clear film over your incisions and will wear off gradually. Steri-strips: Strips of tape over your incisions. You may notice a yellowish color on the skin underneath the steri-strips. This is a substance used to make the steri-strips stick better. Do not pull the steri-strips off - let them fall off. Staples: Cherlynn Polo may be removed before you leave the hospital. If you go home with staples, call [facility] [phone]) for an appointment with your surgeon's nurse to have staples removed in 7 - 10 days. Showering: You may shower two days after your surgery unless otherwise instructed by your surgeon. Wash gently around wounds with warm soapy water, rinse well, and gently pat dry.  If you have a drain, you may need someone to hold this while you shower. Avoid tub baths until staples are removed and incisions are healed.    Medications   Medications should be liquid or crushed if larger than the size of a dime.  Extended release pills should not be crushed.   Depending on the size and number of medications you take, you may need to stagger/change the time you take your medications so that you do not over-fill your pouch.    Make sure you follow-up with your primary care physician to make medication adjustments needed during rapid weight loss  and life-style adjustment.   If you are diabetic, follow up with the doctor that prescribes your diabetes medication(s) within one week after surgery and check your blood sugar regularly.   Do not drive while taking narcotics!   Do not take acetaminophen (Tylenol) and Roxicet or Lortab Elixir at the same time since these pain medications contain acetaminophen.  Diet at home: (First 2 Weeks)  You will see the nutritionist two weeks after your surgery. She will advance your  diet if you are tolerating liquids well. Once at home, if you have severe vomiting or nausea and cannot tolerate clear liquids lasting longer than 1 day, call your surgeon.  For Same Day Surgery Discharge Patients: The day of surgery drink water only: 2 ounces every 4 hours. If you are tolerating water, begin drinking your high protein shake the next morning. For Overnight Stay Patients: Begin high protein shake 2 ounces every 3 hours, 5 - 6 times per day.  Gradually increase the amount you drink as tolerated.  You may find it easier to slowly sip shakes throughout the day.  It is important to get your proteins in first.   Protein Shake   Drink at least 2 ounces of shake 5-6 times per day   Each serving of protein shakes should have a minimum of 15 grams of protein and no more than 5 grams of carbohydrate    Increase the amount of protein shake you drink as tolerated   Protein powder may be added to fluids such as non-fat milk or Lactaid milk (limit to 20 grams added protein powder per serving   The initial goal is to drink at least 8 ounces of protein shake/drink per day (or as directed by the nutritionist). Some examples of protein shakes are [employer], [employer], EAS Edge HP, and Unjury. Hydration   Gradually increase the amount of water and other liquids as tolerated (See Acceptable Fluids)   Gradually increase the amount of protein shake as tolerated     Sip fluids slowly and throughout the day   May use Sugar substitutes, use sparingly (limit to 6 - 8 packets per day).  Your fluid goal is 64 ounces of fluid daily. It may take a few weeks to build up to this.         32 oz (or more) should be clear liquids and 32 oz (or more) should be full liquids.         Liquids should not contain sugar, caffeine, or carbonation!  Acceptable Fluids Clear Liquids:   Water or Sugar-free flavored water, Fruit H2O   Decaffeinated coffee or tea (sugar-free)   Crystal Lite, Wyler's Lite, Minute  Maid Lite   Sugar-free Jell-O   Bouillon or broth   Sugar-free Popsicle:   *Less than 20 calories each; Limit 1 per day   Full Liquids:              Protein Shakes/Drinks + 2 choices per day of other full liquids shown below.    Other full liquids must be: No more than 12 grams of Carbs per serving,  No more than 3 grams of Fat per serving   Strained low-fat cream soup   Non-Fat milk   Fat-free Lactaid Milk   Sugar-free yogurt (Dannon Lite & Fit) Vitamins and Minerals (Start 1 day after surgery unless otherwise directed)   1 Chewable Multivitamin / Multimineral Supplement (i.e. Centrum for Adults)   Chewable Calcium Citrate with Vitamin D-3. Take 1500 mg  each day.           (Example: 3 Chewable Calcium Plus 600 with Vitamin D-3 can be found at [facility])           Do not mix multivitamins containing iron with calcium supplements; take 2 hours   apart   Do not substitute Tums (calcium carbonate) for your calcium   Menstruating women and those at risk for anemia may need extra iron. Talk with your doctor to see if you need additional iron.     If you need extra iron:  Total daily Iron recommendations (including Vitamins) = 50 - 100 mg Iron/day Do not stop taking or change any vitamins or minerals until you talk to your nutritionist or surgeon. Your nutritionist and / or physician must approve all vitamin and mineral supplements. Exercise For maximum success, begin exercising as soon as your doctor recommends. Make sure your physician approves any physical activity.   Depending on fitness level, begin with a simple walking program   Walk 5-15 minutes each day, 7 days per week.    Slowly increase until you are walking 30-45 minutes per day   Consider joining our BELT program. [phone] or email belt@[employer] .edu Things to remember:   You may have sexual relations when you feel comfortable. It is VERY important for female patients to use a reliable birth control method. Fertility often increases  after surgery. Do not get pregnant for at least 18 months.   It is very important to keep all follow up appointments with your surgeon, nutritionist, primary care physician, and behavioral health practitioner. After the first year, please follow up with your bariatric surgeon at least once a year in order to maintain best weight loss results.  [facility]: [phone] [facility]: [phone]   Free counseling is available for you and your family through collaboration between [facility] and [facility]. Please call [phone] and leave a message.    Consider purchasing a medical alert bracelet that says you had lap-band surgery.    [facility] has a free Bariatric Surgery Support Group that meets monthly, the 3rd Thursday, 6 pm, Classroom #1, [place]. You may register online at [email or web address], but registration is not necessary. Select Classes and Support Groups, Bariatric Surgery, or Call [phone]   Do not return to work or drive until cleared by your surgeon   Use your CPAP when sleeping if applicable   Do not lift anything greater than ten pounds for at least two weeks.   You will probably have your first fill (fluid added to your band) 6 weeks after surgery  Talmadge Chad, RN BAriatric Nurse Coordinator

## 2011-10-19 NOTE — Care Management Note (Signed)
    Page 1 of 1   10/19/2011     11:39:55 AM   CARE MANAGEMENT NOTE 10/19/2011  Patient:  [name], [name]   Account Number:  [id number]  Date Initiated:  10/19/2011  Documentation initiated by:  Lorenda Ishihara  Subjective/Objective Assessment:   36 yo female admitted s/p lap banding, hiatal hernia repair.     Action/Plan:   Anticipated DC Date:  10/21/2011   Anticipated DC Plan:  HOME/SELF CARE      DC Planning Services  CM consult      Choice offered to / List presented to:             Status of service:  In process, will continue to follow Medicare Important Message given?   (If response is "NO", the following Medicare IM given date fields will be blank) Date Medicare IM given:   Date Additional Medicare IM given:    Discharge Disposition:    Per UR Regulation:  Reviewed for med. necessity/level of care/duration of stay  If discussed at Long Length of Stay Meetings, dates discussed:    Comments:

## 2011-10-20 ENCOUNTER — Encounter (HOSPITAL_COMMUNITY): Payer: Self-pay | Admitting: Surgery

## 2011-11-01 ENCOUNTER — Encounter: Payer: Self-pay | Admitting: *Deleted

## 2011-11-01 ENCOUNTER — Encounter: Payer: BC Managed Care – PPO | Attending: Surgery | Admitting: *Deleted

## 2011-11-01 VITALS — Ht 73.0 in | Wt 290.5 lb

## 2011-11-01 DIAGNOSIS — Z01818 Encounter for other preprocedural examination: Secondary | ICD-10-CM | POA: Insufficient documentation

## 2011-11-01 DIAGNOSIS — E669 Obesity, unspecified: Secondary | ICD-10-CM

## 2011-11-01 DIAGNOSIS — Z713 Dietary counseling and surveillance: Secondary | ICD-10-CM | POA: Insufficient documentation

## 2011-11-01 NOTE — Progress Notes (Signed)
  Bariatric Class:  Appt start time: 1600 end time:  1700.  2 Week Post-Operative Nutrition Class  Patient was seen on 11/01/2011 for Post-Operative Nutrition education at [facility].   Surgery date: 10/18/11 Surgery type: LAGB Start weight at [facility]: 313.5 lbs  Weight today: 290.5 lbs Weight change: 23.0 lbs Total weight lost: 23.0 lbs BMI: 38.3 kg/m^2  TANITA BODY COMP RESULTS   08/24/11  11/01/11  %Fat  53.6%  53.1%  Fat Mass (lbs)  168.0  154.5  Fat Free Mass (lbs)  145.5  136.0  Total Body Water (lbs)  106.5  99.5   The following the learning objective met the patient during this course:  Identifies Phase 3A (Soft, High Proteins) Dietary Goals and will begin from 2 weeks post-operatively to 2 months post-operatively  Identifies appropriate sources of fluids and proteins   States protein recommendations and appropriate sources post-operatively  Identifies the need for appropriate texture modifications, mastication, and bite sizes when consuming solids  Identifies appropriate multivitamin and calcium sources post-operatively  Describes the need for physical activity post-operatively and will follow MD recommendations  States when to call healthcare provider regarding medication questions or post-operative complications  Handouts given during class include:  Phase 3A: Soft, High Protein Diet Handout  Band Fill Guidelines Handout  Follow-Up Plan: Patient will follow-up at [facility] in 6 weeks for 2 months post-op nutrition visit for diet advancement per MD.

## 2011-11-01 NOTE — Patient Instructions (Addendum)
Patient to follow Phase 3A-Soft, High Protein Diet and follow-up at [facility] in 6 weeks for 2 months post-op nutrition visit for diet advancement. 

## 2011-11-02 ENCOUNTER — Encounter (INDEPENDENT_AMBULATORY_CARE_PROVIDER_SITE_OTHER): Payer: Self-pay | Admitting: Surgery

## 2011-11-02 ENCOUNTER — Ambulatory Visit (INDEPENDENT_AMBULATORY_CARE_PROVIDER_SITE_OTHER): Payer: BC Managed Care – PPO | Admitting: Surgery

## 2011-11-02 VITALS — BP 108/62 | HR 96 | Temp 97.2°F | Resp 16 | Ht 73.0 in | Wt 288.2 lb

## 2011-11-02 DIAGNOSIS — Z9884 Bariatric surgery status: Secondary | ICD-10-CM

## 2011-11-02 NOTE — Progress Notes (Signed)
[name] 36 y.o.  Body mass index is 38.02 kg/(m^2).  Patient Active Problem List  Diagnosis  . DYSPEPSIA&OTHER SPEC DISORDERS FUNCTION STOMACH  . GERD (gastroesophageal reflux disease)  . Obesity  . Lapband APS August 2013 with post hiatus hernia repair    Allergies  Allergen Reactions  . Fluticasone Other (See Comments)    headache    Past Surgical History  Procedure Date  . Lumbar disc surgery 06/2004, 08/2010    L5-S, laminectomy/disection  . Upper gastrointestinal endoscopy 07/01/2009    JACOBS MD  . Cesarean section 03/15/04 & 04/27/07  . Breath tek h pylori 08/08/2011    Procedure: BREATH TEK H PYLORI;  Surgeon: Valarie Merino, MD;  Location: [facility];  Service: General;  Laterality: N/A;  715  . Laparoscopic gastric banding 10/18/2011    Procedure: LAPAROSCOPIC GASTRIC BANDING;  Surgeon: Valarie Merino, MD;  Location: [facility];  Service: General;  Laterality: N/A;  . Hiatal hernia repair 10/18/2011    Procedure: LAPAROSCOPIC REPAIR OF HIATAL HERNIA;  Surgeon: Valarie Merino, MD;  Location: [facility];  Service: General;  Laterality: N/A;   Carollee Herter, MD No diagnosis found.  Incisions OK.  Still complaining of nocturnal reflux.  This despite posterior HH repair.  Hopefully this will get better with edema resolution.  I want to see her back in one month for her first fill.  [name], MD, [facility] [phone] beeper [phone]  11/02/2011 4:40 PM

## 2011-11-02 NOTE — Patient Instructions (Addendum)
Thanks for your patience.  If you need further assistance after leaving the office, please call our office and speak with a [facility] nurse.  [phone].  If you want to leave a message for Dr. Saniah Schroeter, please call his office phone at [phone]. 

## 2011-11-07 ENCOUNTER — Other Ambulatory Visit: Payer: Self-pay | Admitting: Gastroenterology

## 2011-12-01 ENCOUNTER — Encounter (INDEPENDENT_AMBULATORY_CARE_PROVIDER_SITE_OTHER): Payer: Self-pay | Admitting: Surgery

## 2011-12-01 ENCOUNTER — Ambulatory Visit (INDEPENDENT_AMBULATORY_CARE_PROVIDER_SITE_OTHER): Payer: BC Managed Care – PPO | Admitting: Surgery

## 2011-12-01 VITALS — BP 116/88 | HR 70 | Temp 97.4°F | Resp 14 | Ht 73.0 in | Wt 284.2 lb

## 2011-12-01 DIAGNOSIS — Z9884 Bariatric surgery status: Secondary | ICD-10-CM

## 2011-12-01 NOTE — Patient Instructions (Addendum)

## 2011-12-01 NOTE — Progress Notes (Signed)
Carlon G Flicker Body mass index is 37.50 kg/(m^2).  Having regurgitation:  No but having GERD that is requiring Nexium.  She had a one suture closure of the hiatus with 10 cc band test.    Nocturnal reflux?  maybe  Amount of fill  1.5--first fill  Will see back in 6 weeks.  Will try to lower carb intake.

## 2011-12-14 ENCOUNTER — Encounter: Payer: BC Managed Care – PPO | Attending: Surgery | Admitting: *Deleted

## 2011-12-14 VITALS — Ht 73.0 in | Wt 279.5 lb

## 2011-12-14 DIAGNOSIS — E669 Obesity, unspecified: Secondary | ICD-10-CM

## 2011-12-14 DIAGNOSIS — Z713 Dietary counseling and surveillance: Secondary | ICD-10-CM | POA: Insufficient documentation

## 2011-12-14 DIAGNOSIS — Z01818 Encounter for other preprocedural examination: Secondary | ICD-10-CM | POA: Insufficient documentation

## 2011-12-14 NOTE — Patient Instructions (Addendum)
Goals:  Follow Phase 3B: High Protein + Non-Starchy Vegetables  Eat 3-6 small meals/snacks, every 3-5 hrs  Increase lean protein foods to meet 60-80g goal  Increase fluid intake to 64oz +  Avoid drinking 15 minutes before, during and 30 minutes after eating  Aim for >30 min of physical activity daily 

## 2011-12-14 NOTE — Progress Notes (Signed)
Follow-up visit:  8 Weeks Post-Operative LAGB Surgery  Medical Nutrition Therapy:  Appt start time: 1245   End time:  1325.  Primary concerns today: Post-operative Bariatric Surgery Nutrition Management. No problems reported. First fill on 12/01/11 and feels she is in the yellow zone. Eating large portions of meat at meals and increased fat intake noted via almonds. Discussed fat content in almonds and watching meat portions. Some constipation d/t meds, but is treating with OTC products.  Advanced to phase 3B, which should help with constipation some.   Surgery date: 10/18/11 Surgery type: LAGB Start weight at [facility]: 313.5 lbs  Weight today: 279.5 lbs Weight change: 11.0 lbs Total weight lost: 34.0 lbs BMI: 36.9 kg/m^2  TANITA BODY COMP RESULTS   08/24/11  11/01/11 12/14/11  %Fat  53.6%  53.1% 50.0%  Fat Mass (lbs)  168.0  154.5 139.5  Fat Free Mass (lbs)  145.5  136.0 140.0  Total Body Water (lbs)  106.5  99.5 102.5   24-hr recall: B (AM): 3-4 pcs [place] sausage (20-25g P) Snk (AM): Almonds (1/5-2 ozs) - (10-15g P) L (2-3 PM): Chicken or flank steak (5 oz) - (40g P) Snk (PM): None  D (PM): 5 oz steak (40 g P) Snk (PM): None  Fluid intake: 4x (17 oz water) = 70 oz Estimated total protein intake: 115-125 g  Medications:  No changes Supplementation: Taking regularly  Using straws: No Drinking while eating: No Hair loss: No Carbonated beverages: No N/V/D/C: Constipation from meds Last Lap-Band fill: 12/01/11 - still feels she is in the yellow zone  Recent physical activity:  30-60 min cardio; 5-6 days/week  Progress Towards Goal(s):  In progress.  Handouts given during visit include:  Phase 3B: High Protein + Non-Starchy Vegetables   Nutritional Diagnosis:  [place]-3.3 Overweight/obesity As related to history of poor food choices.  As evidenced by patient s/p LAGB surgery following post-op nutritional guidelines for continued weight loss.    Intervention:  Nutrition  education/diet advancement.  Monitoring/Evaluation:  Dietary intake, exercise, lap band fills, and body weight. Follow up in 1 months for 3 month post-op visit.

## 2011-12-18 ENCOUNTER — Encounter: Payer: Self-pay | Admitting: *Deleted

## 2012-01-04 ENCOUNTER — Other Ambulatory Visit: Payer: Self-pay | Admitting: Family Medicine

## 2012-01-05 ENCOUNTER — Other Ambulatory Visit: Payer: Self-pay

## 2012-01-05 MED ORDER — ESOMEPRAZOLE MAGNESIUM 40 MG PO CPDR: 40.0000 mg | DELAYED_RELEASE_CAPSULE | Freq: Two times a day (BID) | ORAL | Status: DC

## 2012-01-17 ENCOUNTER — Other Ambulatory Visit (INDEPENDENT_AMBULATORY_CARE_PROVIDER_SITE_OTHER): Payer: Self-pay | Admitting: Surgery

## 2012-01-17 DIAGNOSIS — N63 Unspecified lump in unspecified breast: Secondary | ICD-10-CM

## 2012-01-17 DIAGNOSIS — R922 Inconclusive mammogram: Secondary | ICD-10-CM

## 2012-01-18 ENCOUNTER — Ambulatory Visit (INDEPENDENT_AMBULATORY_CARE_PROVIDER_SITE_OTHER): Payer: BC Managed Care – PPO | Admitting: Surgery

## 2012-01-18 ENCOUNTER — Encounter (INDEPENDENT_AMBULATORY_CARE_PROVIDER_SITE_OTHER): Payer: Self-pay | Admitting: Surgery

## 2012-01-18 VITALS — BP 126/81 | HR 84 | Temp 97.4°F | Resp 16 | Ht 74.0 in | Wt 268.4 lb

## 2012-01-18 DIAGNOSIS — Z9884 Bariatric surgery status: Secondary | ICD-10-CM

## 2012-01-18 NOTE — Progress Notes (Signed)
[name] Body mass index is 34.46 kg/(m^2).  Having regurgitation:  no  Nocturnal reflux?  no  Amount of fill  1   Has lump in throat.  No regurgitation.   Return 6 weeks.

## 2012-01-18 NOTE — Patient Instructions (Signed)

## 2012-01-19 ENCOUNTER — Ambulatory Visit: Payer: BC Managed Care – PPO | Admitting: *Deleted

## 2012-02-09 ENCOUNTER — Ambulatory Visit
Admission: RE | Admit: 2012-02-09 | Discharge: 2012-02-09 | Disposition: A | Discharge status: Home / Self Care | Payer: BC Managed Care – PPO | Source: Ambulatory Visit | Attending: Surgery | Admitting: Surgery

## 2012-02-09 ENCOUNTER — Encounter: Payer: Self-pay | Admitting: *Deleted

## 2012-02-09 ENCOUNTER — Encounter: Payer: BC Managed Care – PPO | Attending: Surgery | Admitting: *Deleted

## 2012-02-09 VITALS — Ht 73.0 in | Wt 264.0 lb

## 2012-02-09 DIAGNOSIS — R922 Inconclusive mammogram: Secondary | ICD-10-CM

## 2012-02-09 DIAGNOSIS — N63 Unspecified lump in unspecified breast: Secondary | ICD-10-CM

## 2012-02-09 DIAGNOSIS — R9230 Dense breasts, unspecified: Secondary | ICD-10-CM

## 2012-02-09 DIAGNOSIS — Z713 Dietary counseling and surveillance: Secondary | ICD-10-CM | POA: Insufficient documentation

## 2012-02-09 DIAGNOSIS — Z01818 Encounter for other preprocedural examination: Secondary | ICD-10-CM | POA: Insufficient documentation

## 2012-02-09 DIAGNOSIS — E669 Obesity, unspecified: Secondary | ICD-10-CM

## 2012-02-09 NOTE — Progress Notes (Signed)
Follow-up visit:  4 Months Post-Operative LAGB Surgery  Medical Nutrition Therapy:  Appt start time:  915   End time:  935.  Primary concerns today: Post-operative Bariatric Surgery Nutrition Management. Luree returns with additional 15.5 lb wt loss. Reports increased regurgitation and reflux since last fill.  Is able to keep most things down, but starting to have problems with nocturnal reflux and liquids. Thinks she needs some fluid removed. Has appt with Dr. Daphine Deutscher on 03/02/12, though may call to get in sooner.  Otherwise, doing well with no other problems reported.    Surgery date: 10/18/11 Surgery type: LAGB Start weight at [facility]: 313.5 lbs  Weight today: 264.0 lbs Weight change: 15.5 lbs Total weight lost: 49.5 lbs BMI: 34.8 kg/m^2  Goal weight: <200 lbs % goal met: 43%  TANITA BODY COMP RESULTS   08/24/11  11/01/11 12/14/11 02/09/12  Fat Mass (lbs)  168.0  154.5 139.5 123.5  Fat Free Mass (lbs)  145.5  136.0 140.0 140.5  Total Body Water (lbs)  106.5  99.5 102.5 103.0   24-hr recall: B (AM): 3-4 pcs [place] sausage (20-25g) Snk (AM): Almonds (10-15g) L (2-3 PM): 2 Chicken sausages (25g) or 5-7 oz Flank steak (40-60g) Snk (PM): None  D (PM): 5-8 oz steak (40 g) Snk (PM): None  Fluid intake: 4-5x (17 oz water) = 70-85 oz Estimated total protein intake: 80-100+ g  Medications:  No changes Supplementation: Taking regularly  Using straws: No Drinking while eating: Sips if something is stuck Hair loss: No Carbonated beverages: No N/V/D/C: Constipation from Oxycontin - using OTC meds Last Lap-Band fill: 01/18/12 - increasing regurgitation and food sticking with last fill  Recent physical activity:  30-60 min cardio; 5-6 days/week  Progress Towards Goal(s):  In progress.   Nutritional Diagnosis:  [place]-3.3 Overweight/obesity As related to history of poor food choices.  As evidenced by patient s/p LAGB surgery following post-op nutritional guidelines for continued weight  loss.    Intervention:  Nutrition education/diet advancement.  Monitoring/Evaluation:  Dietary intake, exercise, lap band fills, and body weight. Follow up in 3 months for 7 month post-op visit.

## 2012-02-09 NOTE — Patient Instructions (Addendum)
Goals:  Follow Phase 3B: High Protein + Non-Starchy Vegetables  Add 15 grams of carbohydrate (fruit, whole grain, starchy vegetable) with meals   Choose higher fiber foods to help with constipation and fullness  Avoid drinking 15 minutes before, during and 30 minutes after eating  Follow up with Dr. Daphine Deutscher regarding about possible fluid removal

## 2012-02-23 ENCOUNTER — Encounter (INDEPENDENT_AMBULATORY_CARE_PROVIDER_SITE_OTHER): Payer: BC Managed Care – PPO | Admitting: Surgery

## 2012-03-02 ENCOUNTER — Encounter (INDEPENDENT_AMBULATORY_CARE_PROVIDER_SITE_OTHER): Payer: Self-pay | Admitting: Surgery

## 2012-03-02 ENCOUNTER — Ambulatory Visit (INDEPENDENT_AMBULATORY_CARE_PROVIDER_SITE_OTHER): Payer: BC Managed Care – PPO | Admitting: Surgery

## 2012-03-02 VITALS — BP 148/70 | HR 64 | Temp 97.2°F | Resp 12 | Ht 73.0 in | Wt 253.4 lb

## 2012-03-02 DIAGNOSIS — Z9884 Bariatric surgery status: Secondary | ICD-10-CM

## 2012-03-02 NOTE — Patient Instructions (Signed)

## 2012-03-02 NOTE — Progress Notes (Signed)
[name] Body mass index is 33.43 kg/(m^2).  Having regurgitation:  some  Nocturnal reflux?  no  Amount of fill  -0.25  [name] has lost down to 253 which is about 50 pounds from her preoperative weight. She however is been having a little more problem with reflux lately and that was after a 1 cc fill back in November. I went ahead and removed 0.25 cc today and hopefully that we'll get her in the green zone. I will see her again in 3 months ablation he is to see me sooner.

## 2012-05-10 ENCOUNTER — Encounter: Payer: BC Managed Care – PPO | Attending: Surgery | Admitting: *Deleted

## 2012-05-10 ENCOUNTER — Encounter: Payer: Self-pay | Admitting: *Deleted

## 2012-05-10 VITALS — Ht 73.0 in | Wt 234.0 lb

## 2012-05-10 DIAGNOSIS — Z713 Dietary counseling and surveillance: Secondary | ICD-10-CM | POA: Insufficient documentation

## 2012-05-10 DIAGNOSIS — Z01818 Encounter for other preprocedural examination: Secondary | ICD-10-CM | POA: Insufficient documentation

## 2012-05-10 DIAGNOSIS — E669 Obesity, unspecified: Secondary | ICD-10-CM

## 2012-05-10 NOTE — Patient Instructions (Addendum)
Goals:  Follow Phase 3B: High Protein + Non-Starchy Vegetables  Add 15 grams of carbohydrate (fruit, whole grain, starchy vegetable) with meals   Choose higher fiber foods to help with constipation and fullness  Follow up with Dr. Daphine Deutscher regarding about possible addition of fluid

## 2012-05-10 NOTE — Progress Notes (Signed)
Follow-up visit:  7 Months Post-Operative LAGB Surgery  Medical Nutrition Therapy:  Appt start time:  915   End time:  935.  Primary concerns today: Post-operative Bariatric Surgery Nutrition Management. Santrice returns with additional 30 lb wt loss. Reports 0.25 cc removed from band on 03/02/12, which put her in the green zone.  She now, however, states she is feeling hungrier and can eat larger portions at meals. Was consuming ~1500-1600 cal/day prior to fluid removal and now consumes ~1800-1900 cal/day.  Has appt with Dr. Daphine Deutscher on 07/05/12. Likely needs some fluid at this point.     Surgery date: 10/18/11 Surgery type: LAGB Start weight at [facility]: 313.5 lbs  Weight today: 234.0 lbs Weight change: 30.0 lbs Total weight lost: 79.5 lbs BMI: 30.9 kg/m^2  Goal weight: <200 lbs % goal met: 69%  TANITA BODY COMP RESULTS   08/24/11  11/01/11 12/14/11 02/09/12 05/10/12  Fat Mass (lbs)  168.0  154.5 139.5 123.5 102.5  Fat Free Mass (lbs)  145.5  136.0 140.0 140.5 131.5  Total Body Water (lbs)  106.5  99.5 102.5 103.0 96.5   Fluid intake: 4-5x (17 oz water), protein shake = 70-85 oz Estimated total protein intake: 80-100+ g  Medications:  No changes Supplementation: Taking regularly  Using straws: No Drinking while eating: Sips if something is stuck Hair loss: No Carbonated beverages: No N/V/D/C: Constipation when taking Oxycontin - using colace prn. Last Lap-Band fill:  03/02/12 - removed 0.25 cc d/t regurgitation.  Feels hungrier and eating larger portions without satisfaction lately. On border of green/yellow.   Recent physical activity:  Started T25 program @ 25 min; 5-6 days/week  Progress Towards Goal(s):  In progress.   Nutritional Diagnosis:  [place]-3.3 Overweight/obesity As related to history of poor food choices.  As evidenced by patient s/p LAGB surgery following post-op nutritional guidelines for continued weight loss.    Intervention:  Nutrition  education/reinforcement.  Monitoring/Evaluation:  Dietary intake, exercise, lap band fills, and body weight. Follow up in 5 months for 12 month post-op visit.

## 2012-07-05 ENCOUNTER — Ambulatory Visit (INDEPENDENT_AMBULATORY_CARE_PROVIDER_SITE_OTHER): Payer: BC Managed Care – PPO | Admitting: Surgery

## 2012-07-05 ENCOUNTER — Encounter (INDEPENDENT_AMBULATORY_CARE_PROVIDER_SITE_OTHER): Payer: Self-pay | Admitting: Surgery

## 2012-07-05 VITALS — BP 120/68 | HR 76 | Temp 99.1°F | Resp 16 | Ht 73.0 in | Wt 233.6 lb

## 2012-07-05 DIAGNOSIS — Z9884 Bariatric surgery status: Secondary | ICD-10-CM

## 2012-07-05 NOTE — Patient Instructions (Signed)

## 2012-07-05 NOTE — Progress Notes (Signed)
Lapband Fill Encounter Problem List:   Patient Active Problem List   Diagnosis Date Noted  . Obesity 10/19/2011  . Lapband APS August 2013 with post hiatus hernia repair 10/19/2011  . GERD (gastroesophageal reflux disease) 05/05/2010  . DYSPEPSIA&OTHER [facility] DISORDERS FUNCTION STOMACH 06/30/2009    [name] Body mass index is 30.83 kg/(m^2). Weight loss since surgery  Down to 233.  BMI down to 30  Having regurgitation?:  no  Feel that they need a fill?  yes  Nocturnal reflux?  no  Amount of fill  0.15    Port site: Accessed easily  Instructions given and weight loss goals discussed.    [name], MD, FACS

## 2012-07-20 ENCOUNTER — Ambulatory Visit (INDEPENDENT_AMBULATORY_CARE_PROVIDER_SITE_OTHER): Payer: BC Managed Care – PPO | Admitting: Family Medicine

## 2012-07-20 ENCOUNTER — Encounter: Payer: Self-pay | Admitting: Family Medicine

## 2012-07-20 VITALS — BP 114/70 | Wt 224.0 lb

## 2012-07-20 DIAGNOSIS — R350 Frequency of micturition: Secondary | ICD-10-CM

## 2012-07-20 DIAGNOSIS — N390 Urinary tract infection, site not specified: Secondary | ICD-10-CM

## 2012-07-20 LAB — POCT URINALYSIS DIPSTICK
Bilirubin, UA: NEGATIVE
Ketones, UA: NEGATIVE
Protein, UA: 30
Spec Grav, UA: 1.015
pH, UA: 6

## 2012-07-20 MED ORDER — FLUCONAZOLE 150 MG PO TABS: 150.0000 mg | ORAL_TABLET | Freq: Once | ORAL | Status: DC

## 2012-07-20 MED ORDER — SULFAMETHOXAZOLE-TMP DS 800-160 MG PO TABS: 1.0000 | ORAL_TABLET | Freq: Two times a day (BID) | ORAL | Status: DC

## 2012-07-20 NOTE — Patient Instructions (Signed)
You can also use Azo-Standard if the bladder symptoms get worse

## 2012-07-20 NOTE — Progress Notes (Signed)
  Subjective:    Patient ID: [name], female    DOB: 1976-02-29, 37 y.o.   MRN: [id number]  HPI She has a three-day history of urgency, frequency and dysuria. She is presently on her cycle.   Review of Systems     Objective:   Physical Exam Heart and in no distress. Lower abdominal discomfort noted on palpation. Urine microscopic did show white cells, bacteria and epithelial cells       Assessment & Plan:  Frequency of urination - Plan: POCT urinalysis dipstick  UTI (lower urinary tract infection) - Plan: sulfamethoxazole-trimethoprim (BACTRIM DS) 800-160 MG per tablet also recommend using Azo-Standard for symptom relief. We'll also give Diflucan in case of yeast infection.

## 2012-07-26 ENCOUNTER — Telehealth: Payer: Self-pay | Admitting: Family Medicine

## 2012-07-26 MED ORDER — CIPROFLOXACIN HCL 500 MG PO TABS: 500.0000 mg | ORAL_TABLET | Freq: Two times a day (BID) | ORAL | Status: DC

## 2012-07-26 NOTE — Telephone Encounter (Signed)
Let her know that I switched medicines.

## 2012-07-26 NOTE — Telephone Encounter (Signed)
Pt called and states UTI is no better, not improving.  Do you want to change rx?  [employer].

## 2012-07-26 NOTE — Telephone Encounter (Signed)
Apparently the Septra is not working. I will switch her to Cipro.

## 2012-07-26 NOTE — Telephone Encounter (Signed)
Pt informed

## 2012-08-20 ENCOUNTER — Telehealth: Payer: Self-pay | Admitting: Internal Medicine

## 2012-08-20 MED ORDER — MOMETASONE FUROATE 50 MCG/ACT NA SUSP: 2.0000 | Freq: Every day | NASAL | Status: DC

## 2012-08-20 NOTE — Telephone Encounter (Signed)
SENT NASONEX IN

## 2012-08-20 NOTE — Telephone Encounter (Signed)
Refill request for nasonex to [facility]

## 2012-08-30 ENCOUNTER — Other Ambulatory Visit (INDEPENDENT_AMBULATORY_CARE_PROVIDER_SITE_OTHER): Payer: Self-pay | Admitting: Surgery

## 2012-08-30 DIAGNOSIS — N63 Unspecified lump in unspecified breast: Secondary | ICD-10-CM

## 2012-09-13 ENCOUNTER — Other Ambulatory Visit: Payer: BC Managed Care – PPO

## 2012-09-27 ENCOUNTER — Other Ambulatory Visit: Payer: BC Managed Care – PPO

## 2012-10-11 ENCOUNTER — Encounter: Payer: BC Managed Care – PPO | Attending: Surgery | Admitting: *Deleted

## 2012-10-11 ENCOUNTER — Ambulatory Visit
Admission: RE | Admit: 2012-10-11 | Discharge: 2012-10-11 | Disposition: A | Discharge status: Home / Self Care | Payer: BC Managed Care – PPO | Source: Ambulatory Visit | Attending: Surgery | Admitting: Surgery

## 2012-10-11 ENCOUNTER — Encounter: Payer: Self-pay | Admitting: *Deleted

## 2012-10-11 VITALS — Ht 73.0 in | Wt 221.0 lb

## 2012-10-11 DIAGNOSIS — E669 Obesity, unspecified: Secondary | ICD-10-CM

## 2012-10-11 DIAGNOSIS — Z9884 Bariatric surgery status: Secondary | ICD-10-CM | POA: Insufficient documentation

## 2012-10-11 DIAGNOSIS — N63 Unspecified lump in unspecified breast: Secondary | ICD-10-CM

## 2012-10-11 DIAGNOSIS — R1310 Dysphagia, unspecified: Secondary | ICD-10-CM | POA: Insufficient documentation

## 2012-10-11 DIAGNOSIS — Z713 Dietary counseling and surveillance: Secondary | ICD-10-CM | POA: Insufficient documentation

## 2012-10-11 DIAGNOSIS — Z09 Encounter for follow-up examination after completed treatment for conditions other than malignant neoplasm: Secondary | ICD-10-CM | POA: Insufficient documentation

## 2012-10-11 NOTE — Patient Instructions (Addendum)
Goals:  Follow Phase 3B: High Protein + Non-Starchy Vegetables  Add 15 grams of carbohydrate (fruit, whole grain, starchy vegetable) with meals   Continue to choose higher fiber foods to help with constipation and fullness  Follow up with Dr. Daphine Deutscher regarding GERD symptoms  Watch high fat/high carb foods on weekends

## 2012-10-11 NOTE — Progress Notes (Addendum)
Follow-up visit:  12 Months Post-Operative LAGB Surgery  Medical Nutrition Therapy:  Appt start time:  1115   End time:  1145.  Primary concerns today: Post-operative Bariatric Surgery Nutrition Management. [name] returns with additional 13 lb wt loss. Some fluid added back to band 07/05/12 and feels mostly in the green now. Reports having shakes at breakfast because band is very tight.  Reports eating ~1800 cal/day and is eating out more. Has what she wants on the weekends including fried seafood and 2-3 glasses of wine. Will have 6 oz of steak at a sitting. Exercises consistently at moderate-high level.  Taking all supplements as directed.  Surgery date: 10/18/11 Surgery type: LAGB Start weight at [facility]: 313.5 lbs  Weight today: 221.0 lbs Weight change: - 13.0 lbs Total weight lost: 92.5 lbs BMI: 29.2 kg/m^2  Goal weight: <200 lbs % goal met: 81%  TANITA BODY COMP RESULTS   08/24/11  11/01/11 12/14/11 02/09/12 05/10/12 10/11/12  Fat Mass (lbs)  168.0  154.5 139.5 123.5 102.5 91.5  Fat Free Mass (lbs)  145.5  136.0 140.0 140.5 131.5 128.5  Total Body Water (lbs)  106.5  99.5 102.5 103.0 96.5 94.0   Fluid intake: 4-5x (17 oz water), protein shake = 70-85 oz Estimated total protein intake: 80-110+ g  Medications:  Not taking Omega 3 d/t difficulty swallowing pill Supplementation: Taking regularly  Using straws: No Drinking while eating: Sips if something is stuck Hair loss: No Carbonated beverages: No N/V/D/C: Constipation when taking Oxycontin - using colace prn. Last Lap-Band fill:  07/05/12 - 0.15 cc added back to band (0.25 previously removed on 03/02/12).  Starting to feel a little hungrier and eats larger portions without satisfaction lately, though still In the green zone. Cannot tolerate chicken and plain eggs (dry).   Recent physical activity:  Runs a 3-5K @ 4-5 days/wk; ran 90 miles in the last 4 weeks  Progress Towards Goal(s):  In progress.   Nutritional Diagnosis:  -3.3  Overweight/obesity As related to history of poor food choices.  As evidenced by patient s/p LAGB surgery following post-op nutritional guidelines for continued weight loss.    Intervention:  Nutrition education/reinforcement.  Monitoring/Evaluation:  Dietary intake, exercise, lap band fills, and body weight. Follow up in 6 months for 18 month post-op visit.

## 2012-10-12 ENCOUNTER — Encounter (INDEPENDENT_AMBULATORY_CARE_PROVIDER_SITE_OTHER): Payer: Self-pay | Admitting: Surgery

## 2012-10-12 ENCOUNTER — Ambulatory Visit (INDEPENDENT_AMBULATORY_CARE_PROVIDER_SITE_OTHER): Payer: BC Managed Care – PPO | Admitting: Surgery

## 2012-10-12 VITALS — BP 106/58 | HR 76 | Resp 14 | Ht 73.0 in | Wt 219.2 lb

## 2012-10-12 DIAGNOSIS — K219 Gastro-esophageal reflux disease without esophagitis: Secondary | ICD-10-CM

## 2012-10-12 NOTE — Patient Instructions (Signed)
Gastroesophageal Reflux Disease, Adult  Gastroesophageal reflux disease (GERD) happens when acid from your stomach flows up into the esophagus. When acid comes in contact with the esophagus, the acid causes soreness (inflammation) in the esophagus. Over time, GERD may create small holes (ulcers) in the lining of the esophagus.  CAUSES   · Increased body weight. This puts pressure on the stomach, making acid rise from the stomach into the esophagus.  · Smoking. This increases acid production in the stomach.  · Drinking alcohol. This causes decreased pressure in the lower esophageal sphincter (valve or ring of muscle between the esophagus and stomach), allowing acid from the stomach into the esophagus.  · Late evening meals and a full stomach. This increases pressure and acid production in the stomach.  · A malformed lower esophageal sphincter.  Sometimes, no cause is found.  SYMPTOMS   · Burning pain in the lower part of the mid-chest behind the breastbone and in the mid-stomach area. This may occur twice a week or more often.  · Trouble swallowing.  · Sore throat.  · Dry cough.  · Asthma-like symptoms including chest tightness, shortness of breath, or wheezing.  DIAGNOSIS   Your caregiver may be able to diagnose GERD based on your symptoms. In some cases, X-rays and other tests may be done to check for complications or to check the condition of your stomach and esophagus.  TREATMENT   Your caregiver may recommend over-the-counter or prescription medicines to help decrease acid production. Ask your caregiver before starting or adding any new medicines.   HOME CARE INSTRUCTIONS   · Change the factors that you can control. Ask your caregiver for guidance concerning weight loss, quitting smoking, and alcohol consumption.  · Avoid foods and drinks that make your symptoms worse, such as:  · Caffeine or alcoholic drinks.  · Chocolate.  · Peppermint or mint flavorings.  · Garlic and onions.  · Spicy foods.  · Citrus fruits,  such as oranges, lemons, or limes.  · Tomato-based foods such as sauce, chili, salsa, and pizza.  · Fried and fatty foods.  · Avoid lying down for the 3 hours prior to your bedtime or prior to taking a nap.  · Eat small, frequent meals instead of large meals.  · Wear loose-fitting clothing. Do not wear anything tight around your waist that causes pressure on your stomach.  · Raise the head of your bed 6 to 8 inches with wood blocks to help you sleep. Extra pillows will not help.  · Only take over-the-counter or prescription medicines for pain, discomfort, or fever as directed by your caregiver.  · Do not take aspirin, ibuprofen, or other nonsteroidal anti-inflammatory drugs (NSAIDs).  SEEK IMMEDIATE MEDICAL CARE IF:   · You have pain in your arms, neck, jaw, teeth, or back.  · Your pain increases or changes in intensity or duration.  · You develop nausea, vomiting, or sweating (diaphoresis).  · You develop shortness of breath, or you faint.  · Your vomit is green, yellow, black, or looks like coffee grounds or blood.  · Your stool is red, bloody, or black.  These symptoms could be signs of other problems, such as heart disease, gastric bleeding, or esophageal bleeding.  MAKE SURE YOU:   · Understand these instructions.  · Will watch your condition.  · Will get help right away if you are not doing well or get worse.  Document Released: 11/24/2004 Document Revised: 05/09/2011 Document Reviewed: 09/03/2010  Exit[employer]® Patient   Information ©2014 [employer].

## 2012-10-12 NOTE — Progress Notes (Signed)
[name] 37 y.o.  Body mass index is 28.93 kg/(m^2).  Patient Active Problem List   Diagnosis Date Noted  . Obesity 10/19/2011  . Lapband APS August 2013 with post hiatus hernia repair 10/19/2011  . GERD (gastroesophageal reflux disease) 05/05/2010  . DYSPEPSIA&OTHER [facility] DISORDERS FUNCTION STOMACH 06/30/2009    Allergies  Allergen Reactions  . Fluticasone Other (See Comments)    headache    Past Surgical History  Procedure Laterality Date  . Lumbar disc surgery  06/2004, 08/2010    L5-S, laminectomy/disection  . Upper gastrointestinal endoscopy  07/01/2009    JACOBS MD  . Cesarean section  03/15/04 & 04/27/07  . Breath tek h pylori  08/08/2011    Procedure: BREATH TEK H PYLORI;  Surgeon: Valarie Merino, MD;  Location: [facility];  Service: General;  Laterality: N/A;  715  . Laparoscopic gastric banding  10/18/2011    Procedure: LAPAROSCOPIC GASTRIC BANDING;  Surgeon: Valarie Merino, MD;  Location: [facility];  Service: General;  Laterality: N/A;  . Hiatal hernia repair  10/18/2011    Procedure: LAPAROSCOPIC REPAIR OF HIATAL HERNIA;  Surgeon: Valarie Merino, MD;  Location: [facility];  Service: General;  Laterality: N/A;   Carollee Herter, MD 1. GERD (gastroesophageal reflux disease)     88 lb weight loss after surgery 1 year ago.  Now having more issues with GERD.  Will get UGI to assess.  Symptoms include "lump in throat" and discomfort.   Today's BMI is 28.9 Return after UGI [name], MD, [facility] [phone] beeper [phone]  10/12/2012 9:31 AM

## 2012-10-17 ENCOUNTER — Other Ambulatory Visit: Payer: BC Managed Care – PPO

## 2012-10-26 ENCOUNTER — Encounter (INDEPENDENT_AMBULATORY_CARE_PROVIDER_SITE_OTHER): Payer: BC Managed Care – PPO | Admitting: Surgery

## 2012-10-31 ENCOUNTER — Other Ambulatory Visit (INDEPENDENT_AMBULATORY_CARE_PROVIDER_SITE_OTHER): Payer: Self-pay | Admitting: Surgery

## 2012-10-31 ENCOUNTER — Ambulatory Visit
Admission: RE | Admit: 2012-10-31 | Discharge: 2012-10-31 | Disposition: A | Discharge status: Home / Self Care | Payer: BC Managed Care – PPO | Source: Ambulatory Visit | Attending: Surgery | Admitting: Surgery

## 2012-10-31 DIAGNOSIS — K219 Gastro-esophageal reflux disease without esophagitis: Secondary | ICD-10-CM

## 2012-11-01 ENCOUNTER — Encounter (INDEPENDENT_AMBULATORY_CARE_PROVIDER_SITE_OTHER): Payer: BC Managed Care – PPO | Admitting: Surgery

## 2012-11-08 ENCOUNTER — Encounter (INDEPENDENT_AMBULATORY_CARE_PROVIDER_SITE_OTHER): Payer: Self-pay | Admitting: Surgery

## 2012-11-08 ENCOUNTER — Ambulatory Visit (INDEPENDENT_AMBULATORY_CARE_PROVIDER_SITE_OTHER): Payer: BC Managed Care – PPO | Admitting: Surgery

## 2012-11-08 VITALS — BP 140/86 | HR 72 | Resp 16 | Ht 73.0 in | Wt 222.6 lb

## 2012-11-08 DIAGNOSIS — Z9884 Bariatric surgery status: Secondary | ICD-10-CM

## 2012-11-08 NOTE — Patient Instructions (Addendum)
Thanks for your patience.  If you need further assistance after leaving the office, please call our office and speak with a [facility] nurse.  [phone].  If you want to leave a message for Dr. Kianna Billet, please call his office phone at [phone]. 

## 2012-11-08 NOTE — Progress Notes (Signed)
Lapband Fill Encounter Problem List:   Patient Active Problem List   Diagnosis Date Noted  . Obesity 10/19/2011  . Lapband APS August 2013 with post hiatus hernia repair 10/19/2011  . GERD (gastroesophageal reflux disease) 05/05/2010  . DYSPEPSIA&OTHER [facility] DISORDERS FUNCTION STOMACH 06/30/2009    [name] Body mass index is 29.37 kg/(m^2). Weight loss since surgery  85  Having regurgitation?:  maybe  Feel that they need a fill?  uncertain  Nocturnal reflux?  sometimes  Amount of fill  0     Instructions given and weight loss goals discussed.    I reviewed her upper GI series which did not show any frank reflux. I have repair of her hiatal hernia. When she drank a large bolus she did have some stasis. She may be a little bit on the high side at may be more function of when she eats a lot of salt.  Overall she looks great. I should like to be below 200 pounds. I think for now would not adjust her band and she will watch her salt intake.I'll see her back in about 6 weeks.  [name], MD, FACS

## 2012-12-07 ENCOUNTER — Telehealth: Payer: Self-pay | Admitting: Family Medicine

## 2012-12-07 MED ORDER — FLUCONAZOLE 150 MG PO TABS: 150.0000 mg | ORAL_TABLET | Freq: Once | ORAL | Status: DC

## 2012-12-07 NOTE — Telephone Encounter (Signed)
Done--you can advise pt (if she wanted to be notified)

## 2012-12-07 NOTE — Telephone Encounter (Signed)
Pt informed

## 2012-12-10 ENCOUNTER — Other Ambulatory Visit: Payer: Self-pay | Admitting: Gastroenterology

## 2012-12-20 ENCOUNTER — Encounter (INDEPENDENT_AMBULATORY_CARE_PROVIDER_SITE_OTHER): Payer: BC Managed Care – PPO | Admitting: Surgery

## 2013-01-09 ENCOUNTER — Encounter (INDEPENDENT_AMBULATORY_CARE_PROVIDER_SITE_OTHER): Payer: BC Managed Care – PPO | Admitting: Surgery

## 2013-02-20 IMAGING — US US ABDOMEN COMPLETE
1 series · 14 of 25 positions shown · non-contrast
Comparison: None.

CLINICAL DATA: Bariatric preop, abdominal pain.

COMPLETE ABDOMINAL ULTRASOUND

[Series 1: us abdomen complete · 14 of 75 slices shown]
[im 1/75]
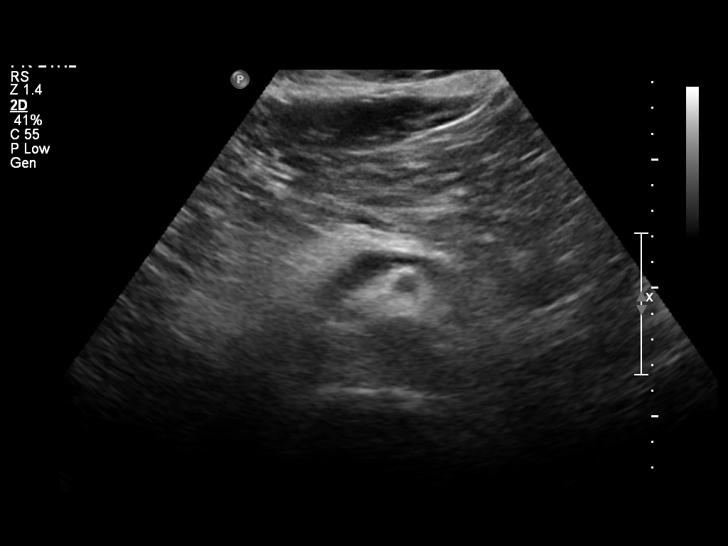
[im 7/75]
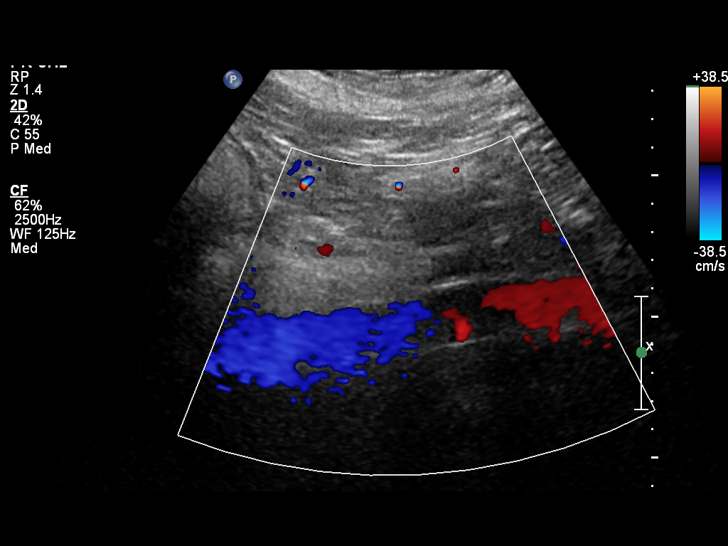
[im 13/75]
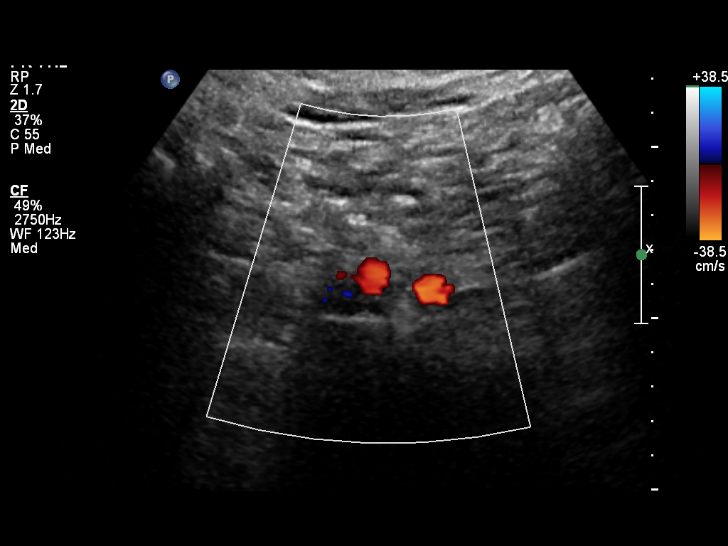
[im 19/75]
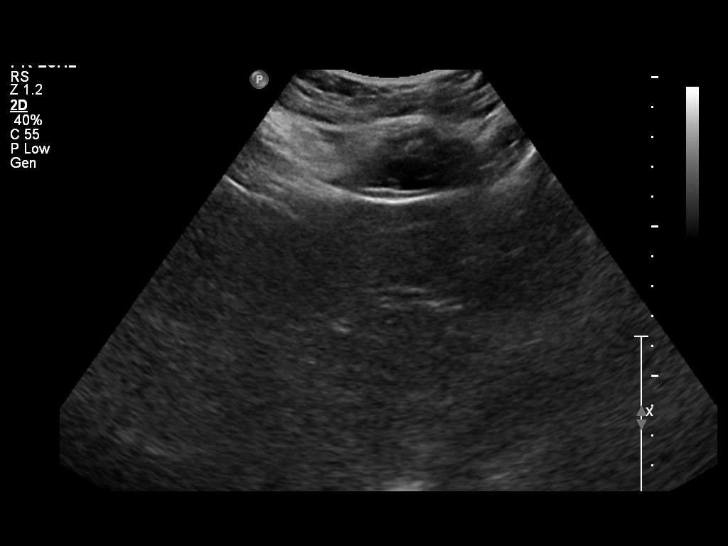
[im 25/75]
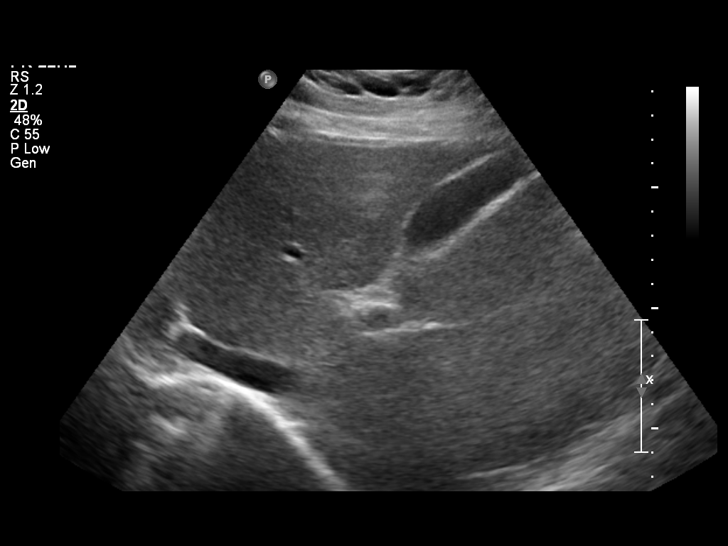
[im 28/75]
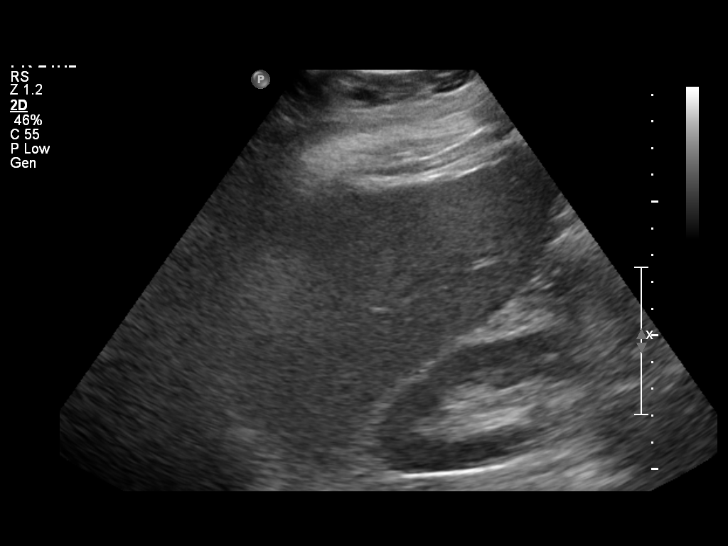
[im 34/75]
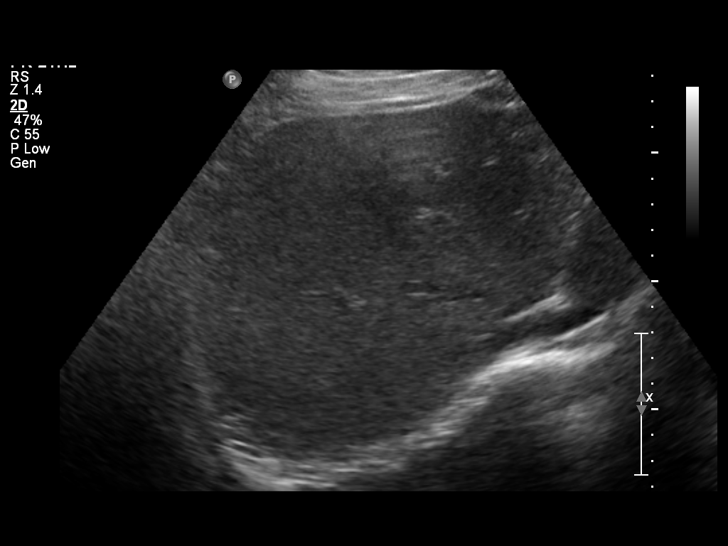
[im 41/75]
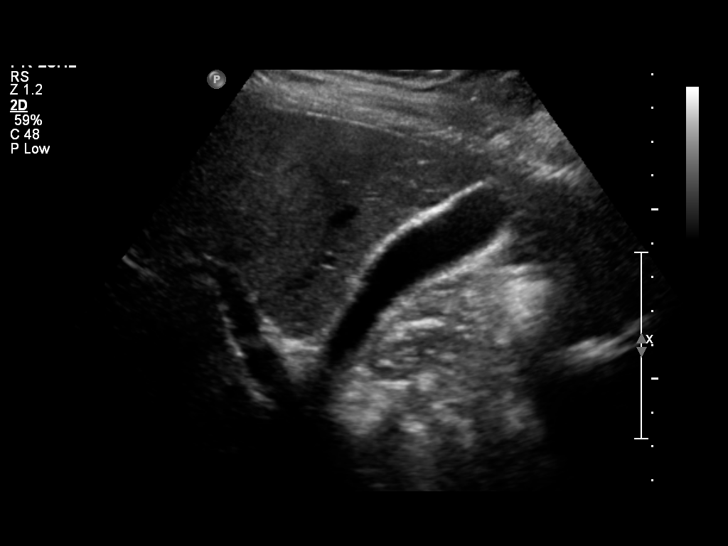
[im 47/75]
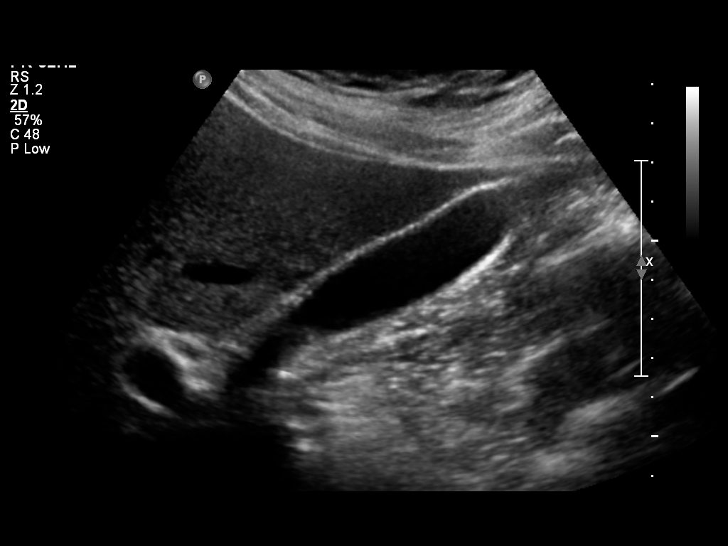
[im 50/75]
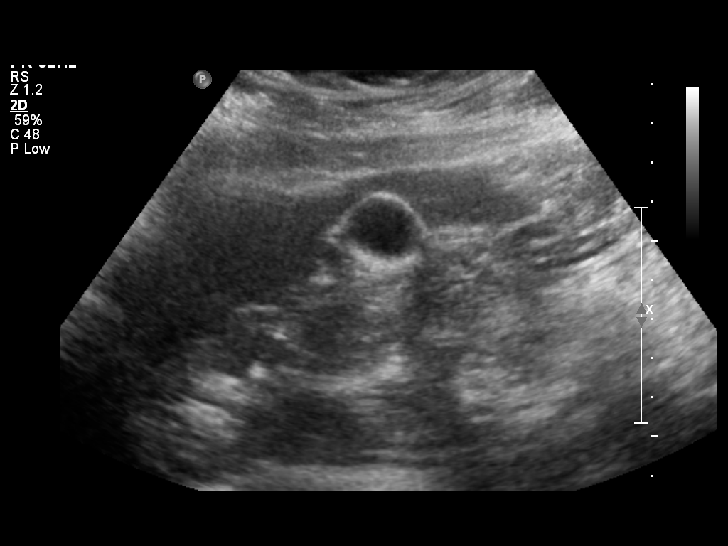
[im 56/75]
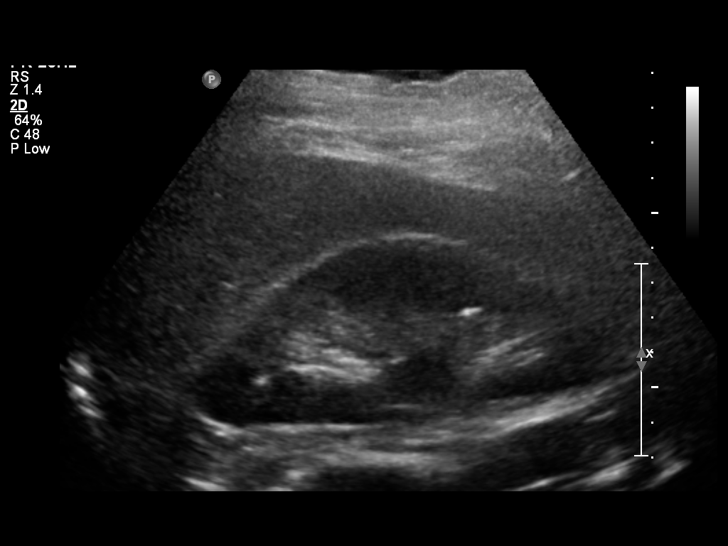
[im 62/75]
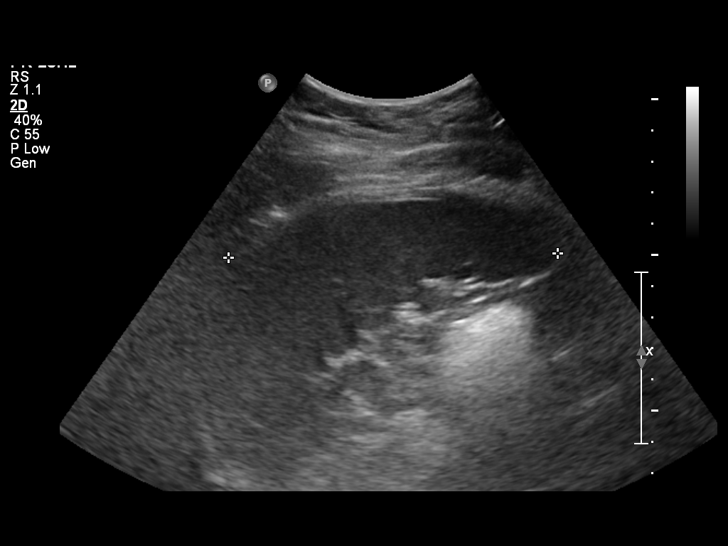
[im 68/75]
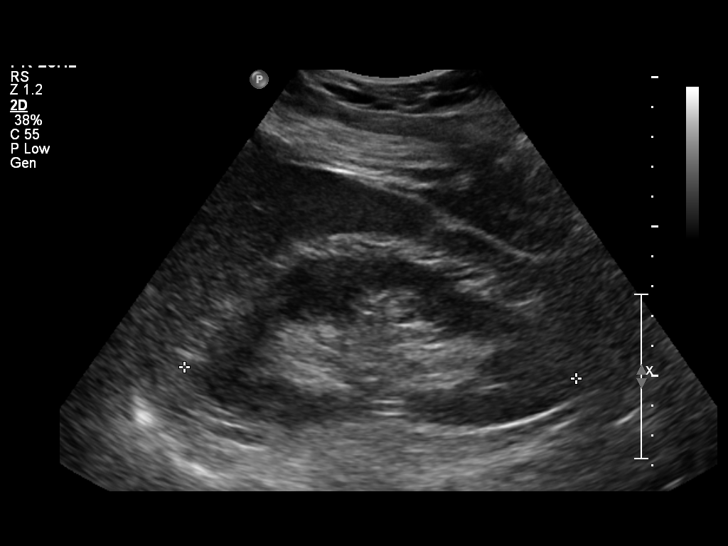
[im 75/75]
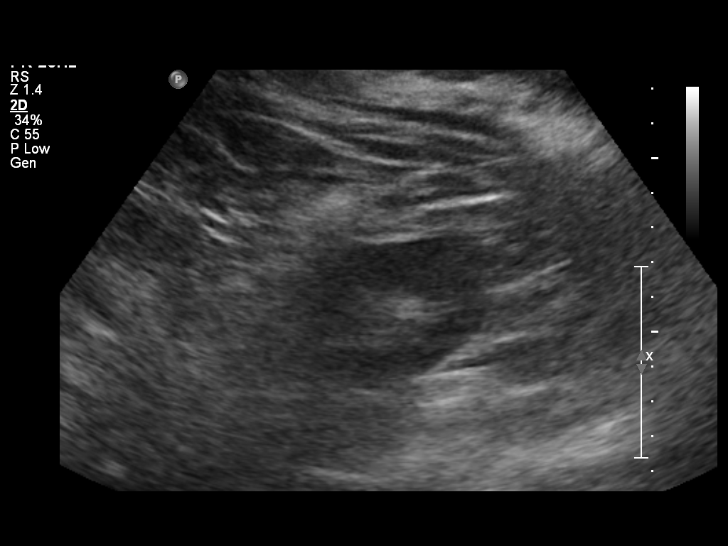

[14 of 25 positions shown; findings below may reference images not displayed]

FINDINGS: Gallbladder:  No gallstones, gallbladder wall thickening, or
pericholecystic fluid.

Common bile duct:  Measures 2 mm, within normal limits.

Liver:  No focal lesion identified.  Within normal limits in
parenchymal echogenicity.

IVC:  Appears normal.

Pancreas:  No focal abnormality seen.

Spleen:  Measures 10.6 cm.  No focal abnormality.

Right Kidney:  Measures 13.0 cm.  No hydronephrosis or focal
abnormality.

Left Kidney:  Measures 13.1 cm.  No hydronephrosis or focal
abnormality.

Abdominal aorta:  No aneurysm identified.
IMPRESSION: Negative abdominal ultrasound.

## 2013-03-13 ENCOUNTER — Other Ambulatory Visit: Payer: Self-pay | Admitting: Medical

## 2013-03-13 ENCOUNTER — Telehealth: Payer: Self-pay | Admitting: Family Medicine

## 2013-03-13 MED ORDER — OSELTAMIVIR PHOSPHATE 75 MG PO CAPS: 75.0000 mg | ORAL_CAPSULE | Freq: Every day | ORAL | Status: DC

## 2013-03-13 NOTE — Telephone Encounter (Signed)
[name] called and wants Tamiflu called in for her and her husband [name] to [facility] [address].  Her son [name] was just diagnosed with flu and they want this to treat them pro-actively.

## 2013-03-13 NOTE — Telephone Encounter (Signed)
I sent the Tamiflu 

## 2013-03-19 ENCOUNTER — Encounter (INDEPENDENT_AMBULATORY_CARE_PROVIDER_SITE_OTHER): Payer: Self-pay | Admitting: Surgery

## 2013-03-22 IMAGING — CT CT MAXILLOFACIAL W/O CM
3 of 4 series · 16 of 47 positions shown, 19 images · non-contrast
Comparison: 05/18/2011.

CLINICAL DATA: Chronic sinusitis.  Headaches and congestion ongoing
for 6 months.  Finished antibiotics 1 week ago.

CT MAXILLOFACIAL WITHOUT CONTRAST
TECHNIQUE: Multidetector CT imaging of the maxillofacial
structures was performed. Multiplanar CT image reconstructions were
also generated.

[Series 2: ax bone · axial · 0.33mm/px · z∈[-58,+32]mm · 10 of 43 slices shown, 13 images]
[im 4/43  brain]
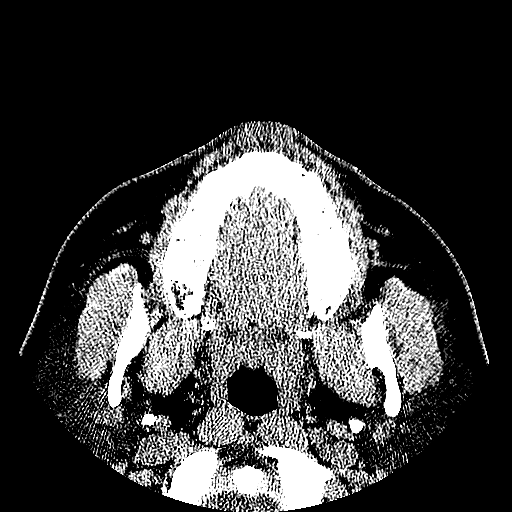
[im 4/43  bone]
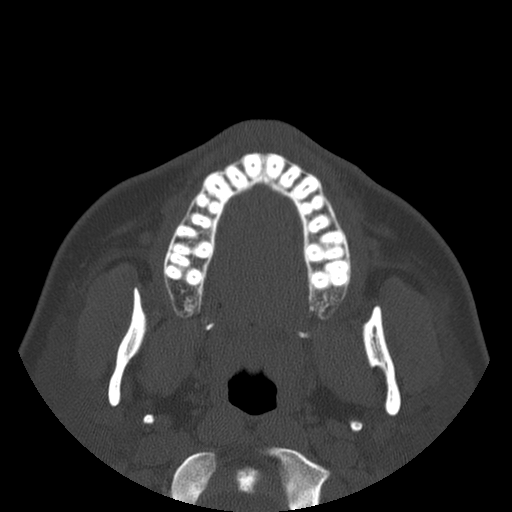
[im 7/43  bone]
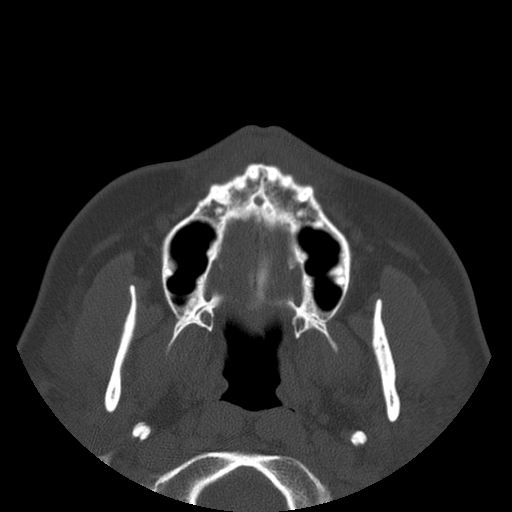
[im 13/43  bone]
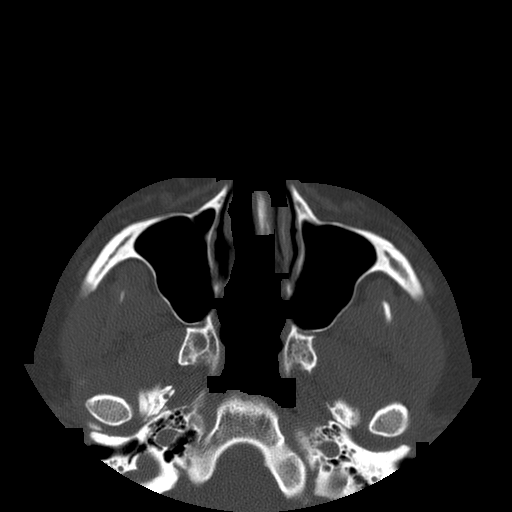
[im 16/43  bone]
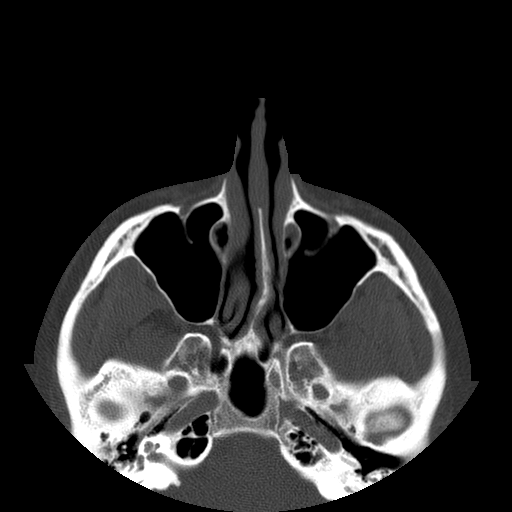
[im 19/43  brain]
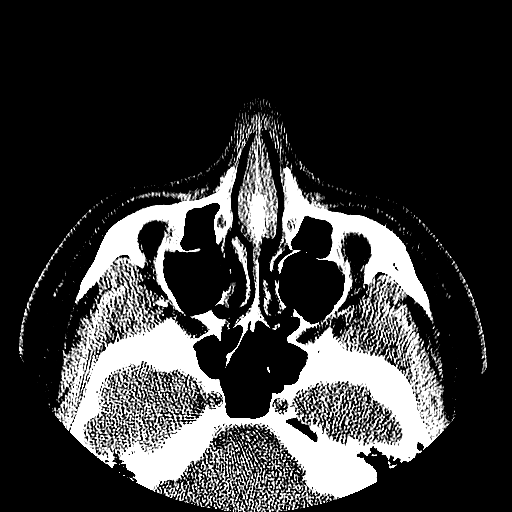
[im 19/43  bone]
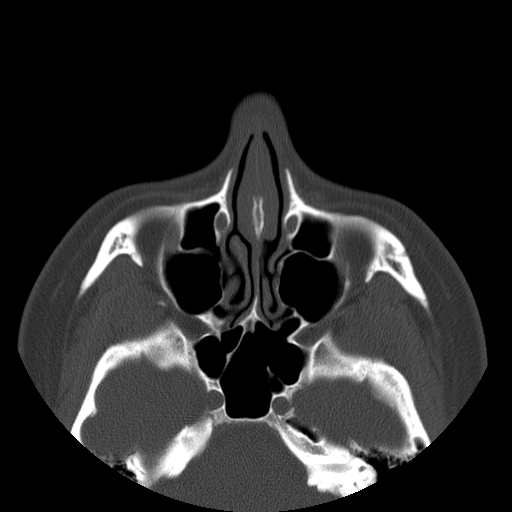
[im 25/43  bone]
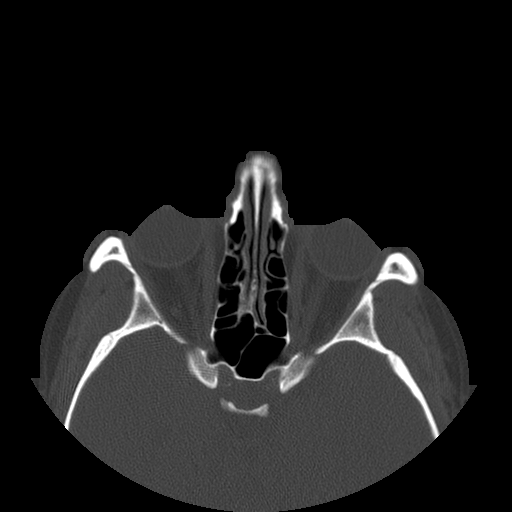
[im 28/43  bone]
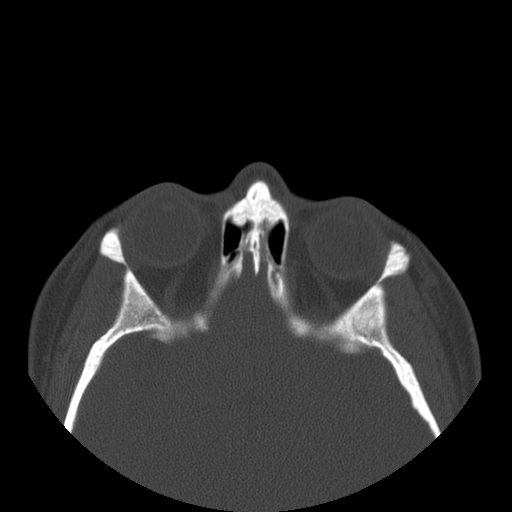
[im 31/43  bone]
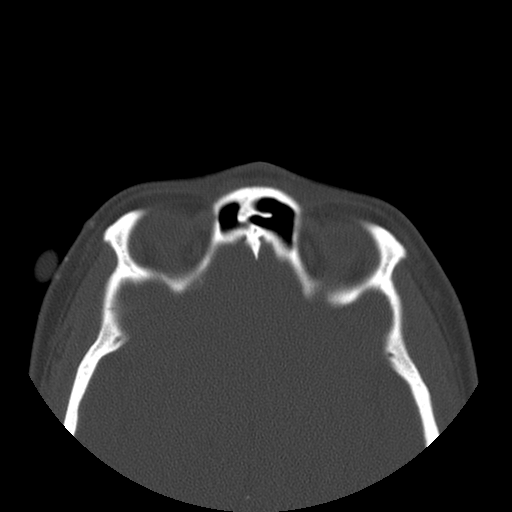
[im 37/43  brain]
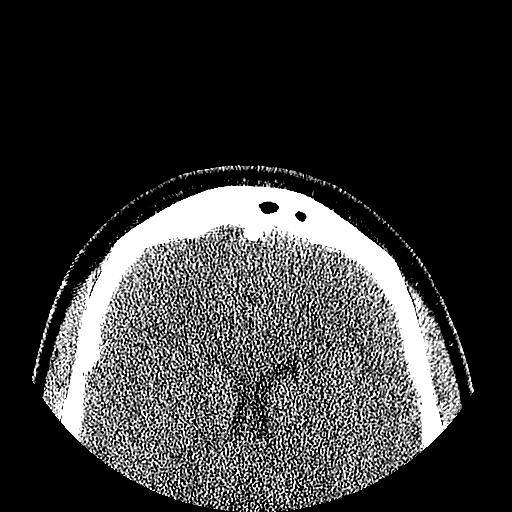
[im 37/43  bone]
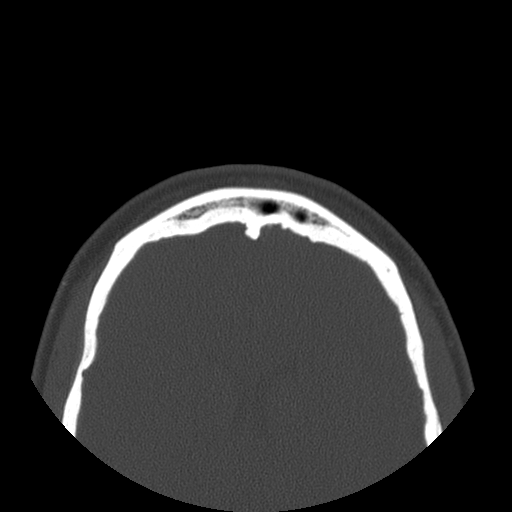
[im 40/43  bone]
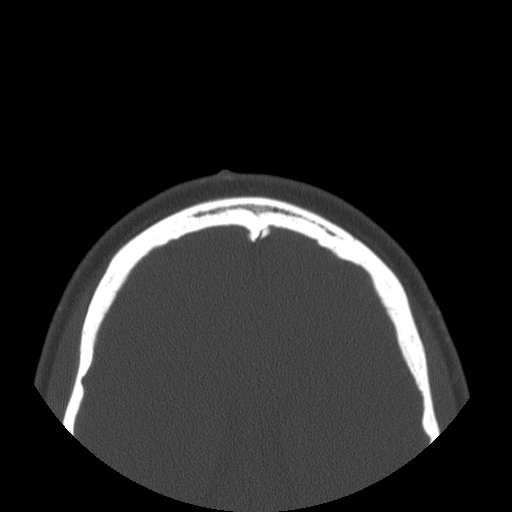

[Series 400: sag · sagittal · 0.33mm/px · 3 of 66 slices shown]
[im 22/66  bone]
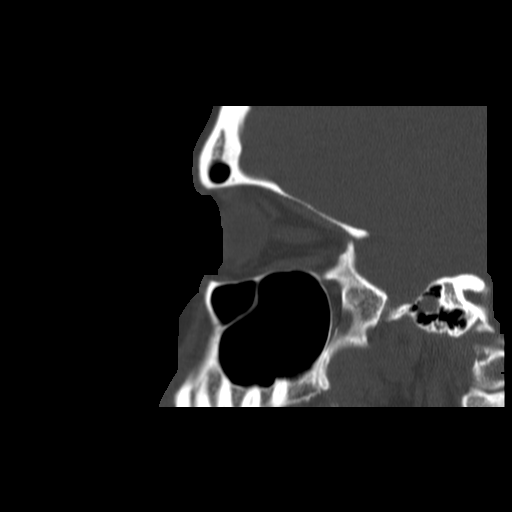
[im 33/66  bone]
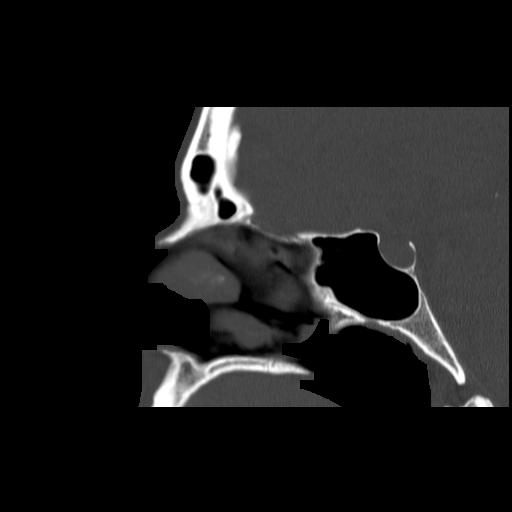
[im 44/66  bone]
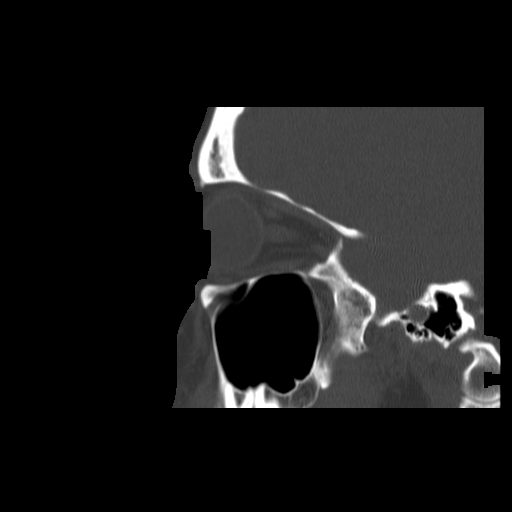

[Series 401: cor · coronal · 0.33mm/px · 3 of 61 slices shown]
[im 21/61  bone]
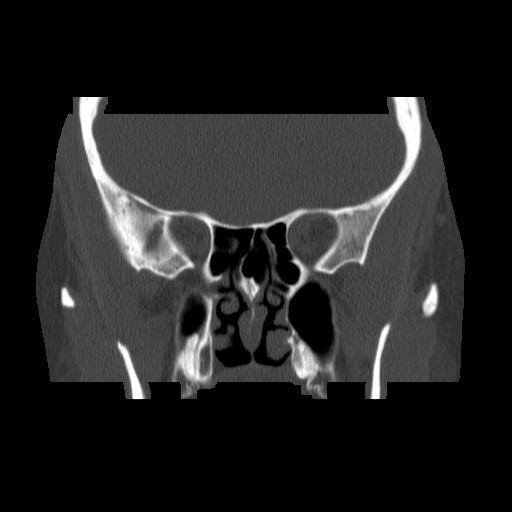
[im 27/61  bone]
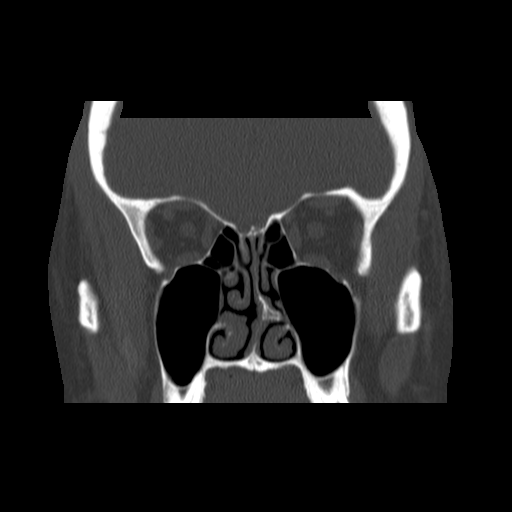
[im 34/61  bone]
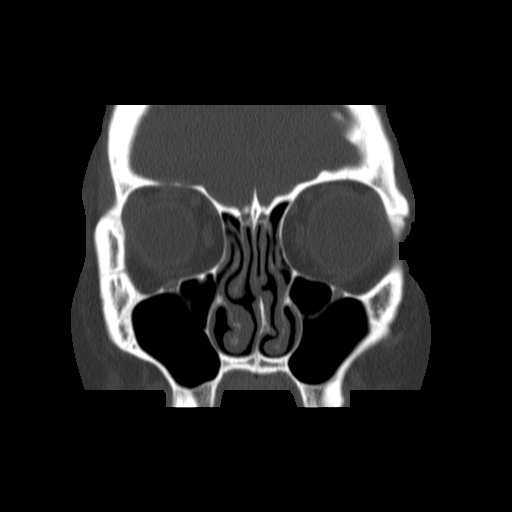

[16 of 47 positions shown; findings below may reference images not displayed]

FINDINGS: Septation anterior medial aspect of the maxillary sinuses
bilaterally.  Minimal mucosal thickening along the posterior aspect
of the right septation and along the course of patent infundibulum
bilaterally.

Paranasal sinuses are otherwise clear.

Left sphenoid sinus air cells larger than the right with septation
directed towards the right carotid artery.  The carotid arteries
project into the posterior margin of the sphenoid sinus air cells
more notable on the right with bony contour is markedly thinned.
The optic nerve as it crosses through the roof of the sphenoid
sinus air cells has a thin bony margin.

Nasal spur to the left.

The aerated aspect of the petrous apex, visualized portions of the
mastoid air cells and middle ear cavities are clear.

Visualized intracranial structures and orbital structures are
unremarkable.
IMPRESSION: Septation anterior medial aspect of the maxillary sinuses
bilaterally.  Minimal mucosal thickening along the posterior aspect
of the right septation and along the course of patent infundibulum
bilaterally.

Paranasal sinuses are otherwise clear.

## 2013-04-17 ENCOUNTER — Ambulatory Visit: Payer: BC Managed Care – PPO | Admitting: Dietician

## 2014-01-03 ENCOUNTER — Other Ambulatory Visit: Payer: Self-pay

## 2014-01-03 DIAGNOSIS — Z1231 Encounter for screening mammogram for malignant neoplasm of breast: Secondary | ICD-10-CM

## 2014-01-09 ENCOUNTER — Telehealth: Payer: Self-pay | Admitting: Internal Medicine

## 2014-01-09 MED ORDER — SULFAMETHOXAZOLE-TRIMETHOPRIM 800-160 MG PO TABS: 1.0000 | ORAL_TABLET | Freq: Two times a day (BID) | ORAL | Status: DC

## 2014-01-09 MED ORDER — FLUCONAZOLE 150 MG PO TABS: 150.0000 mg | ORAL_TABLET | Freq: Once | ORAL | Status: DC

## 2014-01-09 NOTE — Telephone Encounter (Signed)
She is having UTI symptoms of polyuria, dysuria. She tested her urine and did a microscopic at work which did show 5-10 WBCs and bacteria. I will place her on an antibiotic and give her Diflucan.

## 2014-01-09 NOTE — Telephone Encounter (Signed)
Pt called stating that she is having symptoms of a UTI and she did a UA at her work and tested it and faxed over the results. Her symptoms are burning when urinating and lower abdominal pain and going on 3 days. She states she can not come in anytime soon for a visit but wants to know if you will look at her urine results and then send something in to [facility] [address]. She states she also usually gets a yeast infection with antibiotic so she wants you to send something for that too. Results are in blue folder

## 2014-01-09 NOTE — Telephone Encounter (Signed)
Let her know that I called the medication ij.

## 2014-01-09 NOTE — Telephone Encounter (Signed)
Left message for pt that med was called in  

## 2014-01-16 ENCOUNTER — Encounter: Payer: Self-pay | Admitting: Family Medicine

## 2014-01-21 ENCOUNTER — Ambulatory Visit (INDEPENDENT_AMBULATORY_CARE_PROVIDER_SITE_OTHER): Payer: BC Managed Care – PPO | Admitting: Family Medicine

## 2014-01-21 ENCOUNTER — Encounter: Payer: Self-pay | Admitting: Family Medicine

## 2014-01-21 VITALS — BP 110/70 | HR 74 | Wt 231.0 lb

## 2014-01-21 DIAGNOSIS — N390 Urinary tract infection, site not specified: Secondary | ICD-10-CM

## 2014-01-21 DIAGNOSIS — R300 Dysuria: Secondary | ICD-10-CM

## 2014-01-21 LAB — POCT URINALYSIS DIPSTICK
Bilirubin, UA: NEGATIVE
Blood, UA: NEGATIVE
Glucose, UA: NEGATIVE
Ketones, UA: NEGATIVE
NITRITE UA: NEGATIVE
PH UA: 7.5
PROTEIN UA: 0.3
Spec Grav, UA: 1.02
Urobilinogen, UA: NEGATIVE

## 2014-01-21 MED ORDER — FLUCONAZOLE 150 MG PO TABS: 150.0000 mg | ORAL_TABLET | Freq: Once | ORAL | Status: DC

## 2014-01-21 MED ORDER — CIPROFLOXACIN HCL 500 MG PO TABS: 500.0000 mg | ORAL_TABLET | Freq: Two times a day (BID) | ORAL | Status: DC

## 2014-01-21 NOTE — Progress Notes (Signed)
Subjective:     Patient ID: [name], female   DOB: 05/14/1975, 38 y.o.   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  She developed dysuria with increasing urgency and suprapubic tenderness 2.5 weeks ago for which she originally presented to care.  After presenting for care she was treated with the bactrim and has worsened.  She has failed bactrim treatment in the past.  The patient is a International aid/development workerveterinarian and decided to culture her own urine which showed serratia sensitive to ciprofloxacin, bactrim, with resistance to macrobid.  Alongside her antibiotic treatment she has developed vaginal itching and discharge.  She has never had hematuria, flank pain, fever, or chills with this episode.   Review of Systems per HPI     Objective:   Physical Exam  Blood pressure 110/70, pulse 74, weight 231 lb (104.781 kg), SpO2 98 %. General: Pleasant woman in no acute distress Abd: Soft, nonntender to palpation of upper quadrants.  Guarding with pain present on suprapubic palpation.  No CVA tenderness.  U/A: SG 1.20, pH 7.5, Leuk 70+, Nitrites Negative, Prot 0.3+, Glu 0, Ketones 0, Ubil 0, Bili 0, Bld 0 Microscopic examination was noncontributory    Assessment:     Ms. Victoriano LainFulton has failed her antibiotic course for this episode of UTI.  She has a history of treatment failure with bactrim, and her labs show sensitivity to ciprofloxacin.  She should receive a course of ciprofloxacin with diflucan prophylaxis.   Plan:     Burning with urination - Plan: POCT Urinalysis Dipstick, ciprofloxacin (CIPRO) 500 MG tablet  UTI (lower urinary tract infection) - Plan: ciprofloxacin (CIPRO) 500 MG tablet We discussed that she should consider prophylactic antibiotics after sexual activity to prevent future recurrences.  She should also take diflucan alongside her antibiotics to prevent yeast infection recurrence. She will call  me when she finishes the course of the antibiotic and let me know how she is doing.

## 2014-02-05 ENCOUNTER — Ambulatory Visit
Admission: RE | Admit: 2014-02-05 | Discharge: 2014-02-05 | Disposition: A | Discharge status: Home / Self Care | Payer: BC Managed Care – PPO | Source: Ambulatory Visit

## 2014-02-05 DIAGNOSIS — Z1231 Encounter for screening mammogram for malignant neoplasm of breast: Secondary | ICD-10-CM

## 2014-02-07 ENCOUNTER — Other Ambulatory Visit: Payer: Self-pay | Admitting: Family Medicine

## 2014-02-07 ENCOUNTER — Other Ambulatory Visit (INDEPENDENT_AMBULATORY_CARE_PROVIDER_SITE_OTHER): Payer: Self-pay | Admitting: Surgery

## 2014-02-07 DIAGNOSIS — N6310 Unspecified lump in the right breast, unspecified quadrant: Secondary | ICD-10-CM

## 2014-02-13 ENCOUNTER — Other Ambulatory Visit: Payer: Self-pay | Admitting: Family Medicine

## 2014-02-13 ENCOUNTER — Ambulatory Visit
Admission: RE | Admit: 2014-02-13 | Discharge: 2014-02-13 | Disposition: A | Discharge status: Home / Self Care | Payer: BC Managed Care – PPO | Source: Ambulatory Visit | Attending: Family Medicine | Admitting: Family Medicine

## 2014-02-13 DIAGNOSIS — N6310 Unspecified lump in the right breast, unspecified quadrant: Secondary | ICD-10-CM

## 2014-04-24 ENCOUNTER — Telehealth: Payer: Self-pay | Admitting: Family Medicine

## 2014-04-24 NOTE — Telephone Encounter (Signed)
Pt say she has been having reocurrent yeast infections since she has bee getting UTI's recently. OTC meds are not helping and pt started her period today. Symptoms of yeast infections she is having now is mainly itching. Requesting prescription med for yeast infection

## 2014-04-25 MED ORDER — FLUCONAZOLE 150 MG PO TABS: 150.0000 mg | ORAL_TABLET | Freq: Once | ORAL | Status: DC

## 2014-08-20 ENCOUNTER — Ambulatory Visit (INDEPENDENT_AMBULATORY_CARE_PROVIDER_SITE_OTHER): Payer: BC Managed Care – PPO | Admitting: Medical

## 2014-08-20 ENCOUNTER — Telehealth: Payer: Self-pay | Admitting: Medical

## 2014-08-20 ENCOUNTER — Encounter: Payer: Self-pay | Admitting: Medical

## 2014-08-20 ENCOUNTER — Other Ambulatory Visit: Payer: Self-pay | Admitting: Medical

## 2014-08-20 VITALS — BP 110/60 | HR 62 | Temp 97.7°F | Resp 15 | Wt 256.0 lb

## 2014-08-20 DIAGNOSIS — N390 Urinary tract infection, site not specified: Secondary | ICD-10-CM | POA: Diagnosis not present

## 2014-08-20 LAB — CBC WITH DIFFERENTIAL/PLATELET
Basophils Absolute: 0 10*3/uL (ref 0.0–0.1)
Basophils Relative: 0 % (ref 0–1)
EOS ABS: 0.1 10*3/uL (ref 0.0–0.7)
Eosinophils Relative: 2 % (ref 0–5)
HCT: 40.9 % (ref 36.0–46.0)
Hemoglobin: 13.5 g/dL (ref 12.0–15.0)
Lymphocytes Relative: 38 % (ref 12–46)
Lymphs Abs: 1.8 10*3/uL (ref 0.7–4.0)
MCH: 30.3 pg (ref 26.0–34.0)
MCHC: 33 g/dL (ref 30.0–36.0)
MCV: 91.9 fL (ref 78.0–100.0)
MONOS PCT: 6 % (ref 3–12)
MPV: 9.9 fL (ref 8.6–12.4)
Monocytes Absolute: 0.3 10*3/uL (ref 0.1–1.0)
NEUTROS ABS: 2.6 10*3/uL (ref 1.7–7.7)
NEUTROS PCT: 54 % (ref 43–77)
Platelets: 244 10*3/uL (ref 150–400)
RBC: 4.45 MIL/uL (ref 3.87–5.11)
RDW: 14.1 % (ref 11.5–15.5)
WBC: 4.8 10*3/uL (ref 4.0–10.5)

## 2014-08-20 LAB — BASIC METABOLIC PANEL
BUN: 11 mg/dL (ref 6–23)
CALCIUM: 9 mg/dL (ref 8.4–10.5)
CO2: 24 mEq/L (ref 19–32)
Chloride: 103 mEq/L (ref 96–112)
Creat: 0.89 mg/dL (ref 0.50–1.10)
GLUCOSE: 84 mg/dL (ref 70–99)
Potassium: 4.2 mEq/L (ref 3.5–5.3)
SODIUM: 137 meq/L (ref 135–145)

## 2014-08-20 LAB — POCT URINALYSIS DIPSTICK
Bilirubin, UA: NEGATIVE
Glucose, UA: NEGATIVE
Ketones, UA: NEGATIVE
Leukocytes, UA: NEGATIVE
Nitrite, UA: NEGATIVE
PROTEIN UA: NEGATIVE
RBC UA: NEGATIVE
Spec Grav, UA: 1.025
UROBILINOGEN UA: NEGATIVE
pH, UA: 6

## 2014-08-20 MED ORDER — NITROFURANTOIN MONOHYD MACRO 100 MG PO CAPS: 100.0000 mg | ORAL_CAPSULE | Freq: Two times a day (BID) | ORAL | Status: DC

## 2014-08-20 NOTE — Telephone Encounter (Signed)
Refer to Urology for recurrent UTI

## 2014-08-20 NOTE — Telephone Encounter (Signed)
I fax over the patients information to [facility] and they will contact the patient to schedule the appointment.

## 2014-08-20 NOTE — Progress Notes (Signed)
Subjective: Here for recurrent UTI.  Gets at least 2+ UTIs yearly, but gets problems with urinary urgency and discomfort somewhat frequently throughout the year.   Currently has about 3 weeks of symptoms . In between these infections will get symptoms of urinary urgency.  Current symptoms include urinary frequency, urgency, hurts to empty the bladder.  denies blood.  When this episode started there was a foul odor.  No urinary odor now.   Had Cipro left over and took this BID for 2 weeks.  She is a International aid/development worker, cultured the urine and it currently shows E coli, resistant to Cipro.   Started to feel some improvement, but back to the ongoing symptoms now.   No abdominal or back pain, but has lower pelvic pressure.   She is married, no concern for STD.  No vaginal discharge, periods regular.  Not up to date on pap, will be seeing gyn soon.  Urinates after intercourse.  No douching, uses consistent hygiene products.  No incontinence.  No bulging or prolapse.   Drinks a lot of water.  No hx/o diabetes.   No prior urology evaluation.  No other aggravating or relieving factors. No other complaint.  Past Medical History  Diagnosis Date  . Allergy   . GERD (gastroesophageal reflux disease)   . Chronic lower back pain     SCIATIC PAIN  . Arthritis   . Plantar fasciitis   . GERD (gastroesophageal reflux disease)   . Sinus congestion   . Joint pain   . Morbid obesity   . 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  . Recurrent urinary tract infection Yes    Plan: Reviewed the urine culture she has showing E coli, 1-10,000 units (of note on Cipro)  with resistance to cipro, but sensitivity to Macrobid, bactrim and penicillins.   Begin Macrobid, discussed preventative measures, and referral to urology..  She declines STD screen, pelvic exam, will f/u with gynecology for routine gyn exam as well.    [place] was seen today for urinary tract infection.  Diagnoses and all orders for this visit:  Recurrent urinary tract infection Orders: -     Urinalysis Dipstick -     CBC with Differential/Platelet -     Basic metabolic panel -     Ambulatory referral to Urology  Other orders -     nitrofurantoin, macrocrystal-monohydrate, (MACROBID) 100 MG capsule; Take 1 capsule (100 mg total) by mouth 2 (two) times daily.   F/u pending labs

## 2014-08-29 ENCOUNTER — Telehealth: Payer: Self-pay | Admitting: Family Medicine

## 2014-08-29 NOTE — Telephone Encounter (Signed)
Pt called to let us know that urology office still has not contacted her yet

## 2014-09-02 NOTE — Telephone Encounter (Signed)
I left a message with all appointment details on her voicemail.  [facility] [address] [place], [place] [id number] October 20, 2014 @ 130 PM DR. HERRICK

## 2014-09-09 ENCOUNTER — Telehealth: Payer: Self-pay | Admitting: Internal Medicine

## 2014-09-09 ENCOUNTER — Encounter: Payer: Self-pay | Admitting: Family Medicine

## 2014-09-09 NOTE — Telephone Encounter (Signed)
Patient has appointment with urology in sept. She will have husband bring urine by in the morning

## 2014-09-09 NOTE — Telephone Encounter (Signed)
Left message to call back  

## 2014-09-09 NOTE — Telephone Encounter (Signed)
Pt called and states that she believes she has a UTI. She is faxing over results of a urine she did yesterday. She has been on 3 antibiotics in the last 6 weeks but no relief. She has been on bactrim, cipro and sulfa. She is having burning and pain. She would like you to send in a rx to [facility] [address]

## 2014-09-09 NOTE — Telephone Encounter (Signed)
It looks like she has another UTI. Have her bring in a specimen so we can do a culture. Find out which anabiotic work the best. Let her know that I will wait on the culture results but might need to send her to urology

## 2014-09-10 ENCOUNTER — Other Ambulatory Visit (INDEPENDENT_AMBULATORY_CARE_PROVIDER_SITE_OTHER): Payer: BC Managed Care – PPO

## 2014-09-10 DIAGNOSIS — R300 Dysuria: Secondary | ICD-10-CM | POA: Diagnosis not present

## 2014-09-10 DIAGNOSIS — R1030 Lower abdominal pain, unspecified: Secondary | ICD-10-CM

## 2014-09-10 LAB — POCT URINALYSIS DIPSTICK
BILIRUBIN UA: NEGATIVE
GLUCOSE UA: NEGATIVE
KETONES UA: NEGATIVE
Nitrite, UA: NEGATIVE
Protein, UA: NEGATIVE
RBC UA: NEGATIVE
Spec Grav, UA: 1.015
Urobilinogen, UA: NEGATIVE
pH, UA: 6

## 2014-09-13 LAB — URINE CULTURE: Colony Count: 70000

## 2014-09-15 ENCOUNTER — Telehealth: Payer: Self-pay | Admitting: Family Medicine

## 2014-09-15 MED ORDER — CEFUROXIME AXETIL 500 MG PO TABS: 500.0000 mg | ORAL_TABLET | Freq: Two times a day (BID) | ORAL | Status: DC

## 2014-09-15 NOTE — Telephone Encounter (Signed)
Her urine culture came back sensitive to everything except the lungs. She had tried Septra in the past without success. I will place her on Ceftin at high dosing and see if this will help. She will keep me informed. Of interest is the fact that she also did a urine culture at work and came up with the same resistance.

## 2014-09-15 NOTE — Telephone Encounter (Signed)
Pt called to see if she could get results of urine culture

## 2015-04-13 ENCOUNTER — Ambulatory Visit (INDEPENDENT_AMBULATORY_CARE_PROVIDER_SITE_OTHER): Payer: BC Managed Care – PPO | Admitting: Medical

## 2015-04-13 ENCOUNTER — Encounter: Payer: Self-pay | Admitting: Medical

## 2015-04-13 VITALS — BP 110/80 | HR 70 | Temp 98.4°F | Wt 249.0 lb

## 2015-04-13 DIAGNOSIS — J029 Acute pharyngitis, unspecified: Secondary | ICD-10-CM

## 2015-04-13 DIAGNOSIS — J040 Acute laryngitis: Secondary | ICD-10-CM

## 2015-04-13 LAB — POCT RAPID STREP A (OFFICE): RAPID STREP A SCREEN: NEGATIVE

## 2015-04-13 NOTE — Progress Notes (Signed)
Subjective: Chief Complaint  Patient presents with  . Sore Throat    started sunday morning. taken no medication. some conjestion and low grade fever. cough that sometimes is productive   Here for 2 day hx/o sore throat, hoarse voice, some congestion, moderate cough.   Has felt low grade fever, but no chills, body aches, NVD, no wheezing or SOB, no sinus pain, no ear pain.  No sick contacts. Using Nyquil and her pain medication which is helping to suppress cough. No other aggravating or relieving factors. No other complaint.  Past Medical History  Diagnosis Date  . Allergy   . GERD (gastroesophageal reflux disease)   . Chronic lower back pain     SCIATIC PAIN  . Arthritis   . Plantar fasciitis   . GERD (gastroesophageal reflux disease)   . Sinus congestion   . Joint pain   . Morbid obesity (HCC)   . Headache(784.0)    ROS as in subjective   Objective: BP 110/80 mmHg  Pulse 70  Temp(Src) 98.4 F (36.9 C) (Oral)  Wt 249 lb (112.946 kg)  SpO2 98%  LMP 03/30/2015  Gen: wd, wn, nad Skin: warm, dry HENT - TMs pearly, nares patent, conjunctiva WNL Oral MMM, minimal erythema of pharynx Lungs clear Neck: supple, nontender, no mass, no thyromegaly   Assessment: Encounter Diagnoses  Name Primary?  . Sore throat Yes  . Laryngitis      Plan: Strep negative.  Discusses supportive care, rest, hydration, salt water gargles, warm fluids, and if not much improved by end of the week, call back.

## 2015-07-15 ENCOUNTER — Encounter: Payer: Self-pay | Admitting: Family Medicine

## 2015-07-29 ENCOUNTER — Telehealth: Payer: Self-pay | Admitting: Family Medicine

## 2015-07-29 NOTE — Telephone Encounter (Signed)
Pt called and stated that she was having trouble breathing with chest pain. She stated it felt like someone was sitting on her chest.  I could hear pt struggling with breathing. Pt stated she had no other symptoms. Pt denied cold, allergy or any other issues prior to pain starting this morning. Pt was placed on brief hold as I wanted to consult with JCL. JCL was in a room so spoke with Lafonda MossesDiana and we determined pt should  go to ER. Pt was advise to go to ER. Pt sounded in such distress I asked her if I should call 911 for her. She stated she was at work with others around her. I again stressed that she needed to go to ER immediately and should consider calling 911. Pt verbalized understanding. Sending this info back to [facility].

## 2015-08-14 ENCOUNTER — Other Ambulatory Visit: Payer: Self-pay | Admitting: Family Medicine

## 2015-08-14 ENCOUNTER — Ambulatory Visit (INDEPENDENT_AMBULATORY_CARE_PROVIDER_SITE_OTHER): Payer: BC Managed Care – PPO | Admitting: Family Medicine

## 2015-08-14 ENCOUNTER — Encounter: Payer: Self-pay | Admitting: Family Medicine

## 2015-08-14 VITALS — BP 110/70 | HR 71 | Wt 246.0 lb

## 2015-08-14 DIAGNOSIS — R079 Chest pain, unspecified: Secondary | ICD-10-CM

## 2015-08-14 DIAGNOSIS — L989 Disorder of the skin and subcutaneous tissue, unspecified: Secondary | ICD-10-CM

## 2015-08-14 MED ORDER — CELECOXIB 200 MG PO CAPS: 200.0000 mg | ORAL_CAPSULE | Freq: Two times a day (BID) | ORAL | Status: DC

## 2015-08-14 NOTE — Progress Notes (Signed)
   Subjective:    Patient ID: [name], female    DOB: 09/14/1975, 40 y.o.   MRN: [id number]  HPI she is here for consult concerning chest pain. She was seen in an urgent care center for evaluation of this several weeks ago. No particular diagnosis could be made. That record was reviewed. They gave her an extensive workup including blood work x-rays and EKG. She was told this was costochondritis although she is not sure truly is. She is having much less discomfort. No chest pain but fullness sensation, no shortness of breath and dyspnea on exertion, weakness. She also was concerned that this could be reflux related but is having no acid indigestion. She cannot relate this to body position specifically lying down. She also has a lesion present on her left medial mid thigh area. She states that it has grown and is slightly itchy.  Review of Systems     Objective:   Physical Exam Alert and in no distress. Tympanic membranes and canals are normal. Pharyngeal area is normal. Neck is supple without adenopathy or thyromegaly. Cardiac exam shows a regular sinus rhythm without murmurs or gallops. Lungs are clear to auscultation.A slightly greater than 1 cm round brownish pigmented confluence lesion is noted with well-demarcated borders. There are a few satellite lesions near it.       Assessment & Plan:  Chest pain, unspecified chest pain type - Plan: celecoxib (CELEBREX) 200 MG capsule  Skin lesion of left leg Discussed treatment of her chest pain with an NSAID . She has had difficulty with GI distress in the past with these. I will therefore give her Celebrex. She will let me how she tolerates that. Also discussed the use of Prilosec 40 mg. She will consider also doing this to see which will help.  The skin lesion was injected with Xylocaine and epinephrine. A 2 mm punch biopsy was taken.

## 2016-01-01 ENCOUNTER — Telehealth: Payer: Self-pay | Admitting: Family Medicine

## 2016-01-01 MED ORDER — FLUCONAZOLE 150 MG PO TABS
ORAL_TABLET | ORAL | 0 refills | Status: DC
Start: 2016-01-01 — End: 2019-10-02

## 2016-01-01 NOTE — Telephone Encounter (Signed)
Pt called and states that she is going to the denisit and they want her to go on a antibiotic she has some infection in her one her teeth and has a broken tooth that has to be fixed, and they want her to be on a antibiotic for a week before they fix  It pt would like it sent it to [facility] [address] - [place], [place] - [id number] pt can be reached at [phone] with any questions

## 2016-03-07 ENCOUNTER — Telehealth: Payer: Self-pay | Admitting: Family Medicine

## 2016-03-07 MED ORDER — NITROFURANTOIN MONOHYD MACRO 100 MG PO CAPS: 100.0000 mg | ORAL_CAPSULE | Freq: Two times a day (BID) | ORAL | 3 refills | Status: DC

## 2016-03-07 NOTE — Telephone Encounter (Signed)
Pt called and stated that she is sending over a urine culture that she did at work. She is requesting something be sent in for her. She states she has reoccurring uti's. Pt can be reached at [phone]. I am bring back to [employer] desk.

## 2016-03-07 NOTE — Telephone Encounter (Signed)
She has been having dysuria symptoms and frequency. She did do a culture on her urine which did show Klebsiella and Escherichia coli. Sensitive to Macrobid which I will call him. Also encouraged her to empty her bladder and take a pill after sexual activity.

## 2016-04-14 ENCOUNTER — Encounter: Payer: Self-pay | Admitting: Family Medicine

## 2016-05-16 ENCOUNTER — Encounter (HOSPITAL_COMMUNITY): Payer: Self-pay

## 2016-05-24 ENCOUNTER — Other Ambulatory Visit: Payer: Self-pay | Admitting: Family Medicine

## 2016-05-24 DIAGNOSIS — Z1231 Encounter for screening mammogram for malignant neoplasm of breast: Secondary | ICD-10-CM

## 2016-06-16 ENCOUNTER — Ambulatory Visit
Admission: RE | Admit: 2016-06-16 | Discharge: 2016-06-16 | Disposition: A | Discharge status: Home / Self Care | Payer: BC Managed Care – PPO | Source: Ambulatory Visit | Attending: Family Medicine | Admitting: Family Medicine

## 2016-06-16 DIAGNOSIS — Z1231 Encounter for screening mammogram for malignant neoplasm of breast: Secondary | ICD-10-CM

## 2017-06-29 ENCOUNTER — Ambulatory Visit: Payer: BC Managed Care – PPO | Admitting: Family Medicine

## 2017-06-29 ENCOUNTER — Encounter: Payer: Self-pay | Admitting: Family Medicine

## 2017-06-29 VITALS — BP 112/82 | HR 67 | Temp 97.8°F | Ht 72.0 in | Wt 260.6 lb

## 2017-06-29 DIAGNOSIS — Z9884 Bariatric surgery status: Secondary | ICD-10-CM | POA: Diagnosis not present

## 2017-06-29 DIAGNOSIS — N926 Irregular menstruation, unspecified: Secondary | ICD-10-CM | POA: Diagnosis not present

## 2017-06-29 DIAGNOSIS — R399 Unspecified symptoms and signs involving the genitourinary system: Secondary | ICD-10-CM | POA: Diagnosis not present

## 2017-06-29 DIAGNOSIS — E669 Obesity, unspecified: Secondary | ICD-10-CM

## 2017-06-29 LAB — POCT URINALYSIS DIP (PROADVANTAGE DEVICE)
BILIRUBIN UA: NEGATIVE
Glucose, UA: NEGATIVE mg/dL
Ketones, POC UA: NEGATIVE mg/dL
Leukocytes, UA: NEGATIVE
Nitrite, UA: NEGATIVE
PH UA: 7.5 (ref 5.0–8.0)
Protein Ur, POC: NEGATIVE mg/dL
RBC UA: NEGATIVE
Specific Gravity, Urine: 1.015
UUROB: 3.5

## 2017-06-29 NOTE — Progress Notes (Signed)
   Subjective:    Patient ID: [name], female    DOB: 1975-10-28, 42 y.o.   MRN: [id number]  HPI She is here for consult concerning difficulty with urinary frequency.  She did check her urine at work and it was negative.  She is here for follow-up on that.  She has had some slight CVA discomfort but no fever, chills, dysuria.  She has noted some slightly irregular menses recently and states that she is normally quite regular.  Has a history of lap band surgery and admits to some dietary indiscretion.   Review of Systems     Objective:   Physical Exam Alert and in no distress.  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  Mention the possibility of using Azo-Standard.  I will do routine blood screening due to her irregular menses and LAP-BAND surgery.

## 2017-06-30 LAB — CBC WITH DIFFERENTIAL/PLATELET
BASOS: 0 %
Basophils Absolute: 0 10*3/uL (ref 0.0–0.2)
EOS (ABSOLUTE): 0.1 10*3/uL (ref 0.0–0.4)
EOS: 1 %
HEMATOCRIT: 40.4 % (ref 34.0–46.6)
HEMOGLOBIN: 12.8 g/dL (ref 11.1–15.9)
IMMATURE GRANS (ABS): 0 10*3/uL (ref 0.0–0.1)
IMMATURE GRANULOCYTES: 0 %
Lymphocytes Absolute: 1.4 10*3/uL (ref 0.7–3.1)
Lymphs: 27 %
MCH: 28.8 pg (ref 26.6–33.0)
MCHC: 31.7 g/dL (ref 31.5–35.7)
MCV: 91 fL (ref 79–97)
MONOS ABS: 0.4 10*3/uL (ref 0.1–0.9)
Monocytes: 7 %
NEUTROS PCT: 65 %
Neutrophils Absolute: 3.6 10*3/uL (ref 1.4–7.0)
Platelets: 309 10*3/uL (ref 150–379)
RBC: 4.44 x10E6/uL (ref 3.77–5.28)
RDW: 13.9 % (ref 12.3–15.4)
WBC: 5.4 10*3/uL (ref 3.4–10.8)

## 2017-06-30 LAB — COMPREHENSIVE METABOLIC PANEL
ALT: 11 IU/L (ref 0–32)
AST: 18 IU/L (ref 0–40)
Albumin/Globulin Ratio: 1.4 (ref 1.2–2.2)
Albumin: 4.3 g/dL (ref 3.5–5.5)
Alkaline Phosphatase: 79 IU/L (ref 39–117)
BUN/Creatinine Ratio: 13 (ref 9–23)
BUN: 11 mg/dL (ref 6–24)
Bilirubin Total: 0.6 mg/dL (ref 0.0–1.2)
CALCIUM: 9.6 mg/dL (ref 8.7–10.2)
CO2: 24 mmol/L (ref 20–29)
Chloride: 105 mmol/L (ref 96–106)
Creatinine, Ser: 0.88 mg/dL (ref 0.57–1.00)
GFR calc Af Amer: 94 mL/min/{1.73_m2} (ref >59)
GFR, EST NON AFRICAN AMERICAN: 82 mL/min/{1.73_m2} (ref >59)
GLOBULIN, TOTAL: 3.1 g/dL (ref 1.5–4.5)
Glucose: 98 mg/dL (ref 65–99)
Potassium: 4.4 mmol/L (ref 3.5–5.2)
SODIUM: 142 mmol/L (ref 134–144)
TOTAL PROTEIN: 7.4 g/dL (ref 6.0–8.5)

## 2017-06-30 LAB — LIPID PANEL
CHOL/HDL RATIO: 2.2 ratio (ref 0.0–4.4)
Cholesterol, Total: 176 mg/dL (ref 100–199)
HDL: 79 mg/dL (ref >39)
LDL CALC: 89 mg/dL (ref 0–99)
Triglycerides: 41 mg/dL (ref 0–149)
VLDL Cholesterol Cal: 8 mg/dL (ref 5–40)

## 2017-08-17 ENCOUNTER — Ambulatory Visit
Admission: RE | Admit: 2017-08-17 | Discharge: 2017-08-17 | Disposition: A | Discharge status: Home / Self Care | Payer: BC Managed Care – PPO | Source: Ambulatory Visit | Attending: Family Medicine | Admitting: Family Medicine

## 2017-08-17 ENCOUNTER — Ambulatory Visit: Payer: BC Managed Care – PPO | Admitting: Family Medicine

## 2017-08-17 ENCOUNTER — Encounter: Payer: Self-pay | Admitting: Family Medicine

## 2017-08-17 VITALS — BP 112/78 | HR 100 | Temp 98.2°F | Wt 266.8 lb

## 2017-08-17 DIAGNOSIS — M2550 Pain in unspecified joint: Secondary | ICD-10-CM | POA: Diagnosis not present

## 2017-08-17 DIAGNOSIS — M25462 Effusion, left knee: Secondary | ICD-10-CM

## 2017-08-17 NOTE — Progress Notes (Signed)
   Subjective:    Patient ID: Unice BaileyMerritt G Hannold, female    DOB: 05/17/1975, 42 y.o.   MRN: [id number]  HPI She complains of a 2-week history of left knee pain.  She states that the knee gave out on her 2 weeks ago and initially had no effusion but the effusion did occur within the last week or so.  She does complain of continued difficulty with diffuse arthralgias in her knees, ankles, wrists and elbows.  Apparently her brother does have a rheumatoid-like arthropathy she has concerns over that.   Review of Systems     Objective:   Physical Exam Left knee exam does show a significant effusion.  No joint line tenderness.  Negative anterior drawer.  Murray's testing negative.  Medial lateral collateral ligaments intact.       Assessment & Plan:  Arthralgia, unspecified joint - Plan: Rheumatoid Arthritis Profile  Effusion of left knee - Plan: DG Knee Complete 4 Views Left, CANCELED: DG Knee Complete 4 Views Left, CANCELED: DG Knee 1-2 Views Left  The x-ray does show arthritic changes but also some loose bodies.  I discussed this with her and will refer to Dr. Shelle IronBeane or 1 of his colleagues.

## 2017-08-18 ENCOUNTER — Other Ambulatory Visit: Payer: Self-pay

## 2017-08-18 DIAGNOSIS — M25562 Pain in left knee: Principal | ICD-10-CM

## 2017-08-18 DIAGNOSIS — G8929 Other chronic pain: Secondary | ICD-10-CM

## 2017-08-18 DIAGNOSIS — M25561 Pain in right knee: Principal | ICD-10-CM

## 2017-08-18 LAB — RA EXPANDED PROFILE
CRP: 5 mg/L (ref 0–10)
Cyclic Citrullin Peptide Ab: 3 units (ref 0–19)
Rhuematoid fact SerPl-aCnc: <10 IU/mL (ref 0.0–13.9)
SED RATE: 21 mm/h (ref 0–32)

## 2018-03-01 ENCOUNTER — Ambulatory Visit: Payer: BC Managed Care – PPO | Admitting: Family Medicine

## 2018-03-01 ENCOUNTER — Encounter: Payer: Self-pay | Admitting: Family Medicine

## 2018-03-01 VITALS — BP 118/80 | HR 105 | Temp 97.8°F | Wt 270.2 lb

## 2018-03-01 DIAGNOSIS — N3000 Acute cystitis without hematuria: Secondary | ICD-10-CM | POA: Diagnosis not present

## 2018-03-01 LAB — POCT URINALYSIS DIP (PROADVANTAGE DEVICE)
BILIRUBIN UA: NEGATIVE
BILIRUBIN UA: NEGATIVE mg/dL
Blood, UA: NEGATIVE
Glucose, UA: NEGATIVE mg/dL
Nitrite, UA: NEGATIVE
PH UA: 8 (ref 5.0–8.0)
Protein Ur, POC: NEGATIVE mg/dL
Specific Gravity, Urine: 1.015
Urobilinogen, Ur: 3.5

## 2018-03-01 MED ORDER — SULFAMETHOXAZOLE-TRIMETHOPRIM 800-160 MG PO TABS: 1.0000 | ORAL_TABLET | Freq: Two times a day (BID) | ORAL | 0 refills | Status: DC

## 2018-03-01 NOTE — Progress Notes (Signed)
   Subjective:    Patient ID: [name], female    DOB: September 07, 1975, 43 y.o.   MRN: [id number]  HPI She complains of a 4-day history of urgency, frequency, dysuria.  She has a previous history of recurrent UTI and has been using Macrobid postcoital but has not done this in the last year.  She has been on different antibiotics in the past for this.  No fever, chills, abdominal pain.   Review of Systems     Objective:   Physical Exam Alert and in no distress.  Urine dipstick was positive for leukocytes.      Assessment & Plan:  Acute cystitis without hematuria - Plan: sulfamethoxazole-trimethoprim (BACTRIM DS,SEPTRA DS) 800-160 MG tablet, Urine Culture Started with Septra pending culture results and continue after that.  Discussed the need for her to get back to using the Macrobid postcoital.

## 2018-03-01 NOTE — Addendum Note (Signed)
Addended by: Renelda Loma on: 03/01/2018 05:06 PM   Modules accepted: Orders

## 2018-03-03 LAB — URINE CULTURE

## 2018-05-09 ENCOUNTER — Other Ambulatory Visit: Payer: Self-pay | Admitting: Surgery

## 2018-05-09 ENCOUNTER — Other Ambulatory Visit (HOSPITAL_COMMUNITY): Payer: Self-pay | Admitting: Surgery

## 2018-05-09 DIAGNOSIS — Z9884 Bariatric surgery status: Secondary | ICD-10-CM

## 2018-05-10 ENCOUNTER — Ambulatory Visit (HOSPITAL_COMMUNITY)
Admission: RE | Admit: 2018-05-10 | Discharge: 2018-05-10 | Disposition: A | Discharge status: Home / Self Care | Payer: BC Managed Care – PPO | Source: Ambulatory Visit | Attending: Surgery | Admitting: Surgery

## 2018-05-10 DIAGNOSIS — Z9884 Bariatric surgery status: Secondary | ICD-10-CM | POA: Diagnosis not present

## 2018-05-11 ENCOUNTER — Other Ambulatory Visit: Payer: Self-pay | Admitting: Surgery

## 2018-05-11 DIAGNOSIS — Z9884 Bariatric surgery status: Secondary | ICD-10-CM

## 2018-10-30 ENCOUNTER — Telehealth: Payer: Self-pay | Admitting: Family Medicine

## 2018-10-30 MED ORDER — NITROFURANTOIN MONOHYD MACRO 100 MG PO CAPS: ORAL_CAPSULE | ORAL | 3 refills | Status: DC

## 2018-10-30 NOTE — Telephone Encounter (Signed)
LVM for pt . KH 

## 2018-10-30 NOTE — Telephone Encounter (Signed)
Pt left message that her gyn used to give her Nitrofurantoin to use after sexual activity to prevent UTI.  Will you give her this rx?    Please advise pt [phone]

## 2018-10-30 NOTE — Telephone Encounter (Signed)
I called it in 

## 2019-02-06 ENCOUNTER — Other Ambulatory Visit: Payer: Self-pay

## 2019-02-06 DIAGNOSIS — Z20822 Contact with and (suspected) exposure to covid-19: Secondary | ICD-10-CM

## 2019-02-09 LAB — NOVEL CORONAVIRUS, NAA: SARS-CoV-2, NAA: NOT DETECTED

## 2019-08-13 ENCOUNTER — Other Ambulatory Visit: Payer: Self-pay | Admitting: Chiropractic Medicine

## 2019-08-13 DIAGNOSIS — M542 Cervicalgia: Secondary | ICD-10-CM

## 2019-09-26 ENCOUNTER — Other Ambulatory Visit: Payer: BC Managed Care – PPO

## 2019-10-02 ENCOUNTER — Encounter: Payer: Self-pay | Admitting: Medical

## 2019-10-02 ENCOUNTER — Other Ambulatory Visit: Payer: Self-pay

## 2019-10-02 ENCOUNTER — Ambulatory Visit: Payer: BC Managed Care – PPO | Admitting: Medical

## 2019-10-02 VITALS — BP 122/82 | HR 83 | Ht 73.0 in | Wt 267.8 lb

## 2019-10-02 DIAGNOSIS — R000 Tachycardia, unspecified: Secondary | ICD-10-CM | POA: Diagnosis not present

## 2019-10-02 DIAGNOSIS — Z569 Unspecified problems related to employment: Secondary | ICD-10-CM | POA: Insufficient documentation

## 2019-10-02 DIAGNOSIS — R002 Palpitations: Secondary | ICD-10-CM

## 2019-10-02 DIAGNOSIS — Z139 Encounter for screening, unspecified: Secondary | ICD-10-CM | POA: Diagnosis not present

## 2019-10-02 DIAGNOSIS — R42 Dizziness and giddiness: Secondary | ICD-10-CM

## 2019-10-02 NOTE — Progress Notes (Addendum)
Subjective:  [name] is a 44 y.o. female who presents for Chief Complaint  Patient presents with  . Tachycardia     Here for c/o tachycardia.   Has had fast heart rate several times in the past year.  In the past has chalked some of this up to anxiety, one time last year due to covid vaccine, one time triggered by muscle relaxer.  Yesterday had a bad episode, felt bad. She checks her pulse and has pulse ox.   With elevated heart rate, generally feels cold, light headed. Pulses generally between 70-90, pulse 62 when woke up today.  But at times 130-140 bpm.   yesterday at rest, pulse was up around 126.   With tachycardia, gets chest pressure, hurts to take deep breath.  With standing can feel lightheaded or dizzy.  Gets some swelling in legs.    She is not a smoker.  No alcohol.  No heavy caffeine but does drink unsweetened tea  She requests rabies titer, works in [employer].  Last titer was a while ago.  No other aggravating or relieving factors.    No other c/o.  Past Medical History:  Diagnosis Date  . Allergy   . Arthritis   . Chronic lower back pain    SCIATIC PAIN  . GERD (gastroesophageal reflux disease)   . GERD (gastroesophageal reflux disease)   . Headache(784.0)   . Joint pain   . Morbid obesity (HCC)   . Plantar fasciitis   . Sinus congestion    Current Outpatient Medications on File Prior to Visit  Medication Sig Dispense Refill  . Magnesium 400 MG CAPS Take 1 capsule by mouth daily.     Marland Kitchen morphine (MS CONTIN) 30 MG 12 hr tablet SMARTSIG:1 Tablet(s) By Mouth Every 12 Hours    . nitrofurantoin, macrocrystal-monohydrate, (MACROBID) 100 MG capsule Use as needed after sexual activity. 20 capsule 3  . OXYCONTIN 30 MG TB12 3 (three) times daily as needed.     . Omega-3 Fatty Acids (OMEGA 3 PO) Take 3 capsules by mouth daily.  (Patient not taking: Reported on 10/02/2019)    . [DISCONTINUED] gabapentin (NEURONTIN) 300 MG capsule Take 300 mg by mouth 5 (five) times  daily.       No current facility-administered medications on file prior to visit.     The following portions of the patient's history were reviewed and updated as appropriate: allergies, current medications, past family history, past medical history, past social history, past surgical history and problem list.  ROS Otherwise as in subjective above    Objective: BP 122/82   Pulse 83   Ht 6\' 1"  (1.854 m)   Wt 267 lb 12.8 oz (121.5 kg)   SpO2 98%   BMI 35.33 kg/m   General appearance: alert, no distress, well developed, well nourished Neck: supple, no lymphadenopathy, no thyromegaly, no masses, no JVD, no bruits Heart: RRR, normal S1, S2, no murmurs Lungs: CTA bilaterally, no wheezes, rhonchi, or rales Pulses: 2+ radial pulses, 2+ pedal pulses, normal cap refill Ext: no edema   EKG indication tachycardia  rate 67 bpm, PR 154 ms, QRS 90 ms, QTC 452 ms, axis 76 degrees, normal sinus rhythm    Assessment: Encounter Diagnoses  Name Primary?  . Tachycardia Yes  . Dizziness   . Palpitation   . Screening for condition   . Adverse exposure in workplace      Plan: We discussed possible causes of fast heart rate, anemia, thyroid,  cardiac arrhythmia, sleep apnea, anxiety, other.  EKG reviewed.  Labs today.  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  Diagnoses and all orders for this visit:  Tachycardia -     Basic metabolic panel -     TSH -     CBC -     EKG 12-Lead  Dizziness -     Basic metabolic panel -     TSH -     CBC -     EKG 12-Lead  Palpitation -     Basic metabolic panel -     TSH -     CBC -     EKG 12-Lead  Screening for condition -     Rabies Neut.Abs Titrat.(RFFIT)  Adverse exposure in workplace -     Rabies Neut.Abs Titrat.(RFFIT)    Follow up pending labs

## 2019-10-03 ENCOUNTER — Encounter: Payer: Self-pay | Admitting: Family Medicine

## 2019-10-03 ENCOUNTER — Other Ambulatory Visit: Payer: Self-pay

## 2019-10-03 DIAGNOSIS — R000 Tachycardia, unspecified: Secondary | ICD-10-CM

## 2019-10-03 DIAGNOSIS — R002 Palpitations: Secondary | ICD-10-CM

## 2019-10-03 LAB — CBC
Hematocrit: 35.9 % (ref 34.0–46.6)
Hemoglobin: 11.2 g/dL (ref 11.1–15.9)
MCH: 26.2 pg — ABNORMAL LOW (ref 26.6–33.0)
MCHC: 31.2 g/dL — ABNORMAL LOW (ref 31.5–35.7)
MCV: 84 fL (ref 79–97)
Platelets: 370 10*3/uL (ref 150–450)
RBC: 4.28 x10E6/uL (ref 3.77–5.28)
RDW: 15.5 % — ABNORMAL HIGH (ref 11.7–15.4)
WBC: 7.2 10*3/uL (ref 3.4–10.8)

## 2019-10-03 LAB — TSH: TSH: 1.44 u[IU]/mL (ref 0.450–4.500)

## 2019-10-03 LAB — BASIC METABOLIC PANEL
BUN/Creatinine Ratio: 14 (ref 9–23)
BUN: 12 mg/dL (ref 6–24)
CO2: 21 mmol/L (ref 20–29)
Calcium: 9 mg/dL (ref 8.7–10.2)
Chloride: 102 mmol/L (ref 96–106)
Creatinine, Ser: 0.87 mg/dL (ref 0.57–1.00)
GFR calc Af Amer: 94 mL/min/{1.73_m2} (ref >59)
GFR calc non Af Amer: 81 mL/min/{1.73_m2} (ref >59)
Glucose: 103 mg/dL — ABNORMAL HIGH (ref 65–99)
Potassium: 4 mmol/L (ref 3.5–5.2)
Sodium: 139 mmol/L (ref 134–144)

## 2019-10-09 ENCOUNTER — Other Ambulatory Visit: Payer: Self-pay

## 2019-10-09 DIAGNOSIS — R42 Dizziness and giddiness: Secondary | ICD-10-CM

## 2019-10-09 DIAGNOSIS — R000 Tachycardia, unspecified: Secondary | ICD-10-CM

## 2019-10-09 DIAGNOSIS — R002 Palpitations: Secondary | ICD-10-CM

## 2019-10-09 NOTE — Progress Notes (Signed)
na

## 2019-10-11 ENCOUNTER — Telehealth: Payer: Self-pay

## 2019-10-11 NOTE — Telephone Encounter (Signed)
Pt. Called stating that she has had two more events of tachycardia and would like to order the holter monitor now as discussed.

## 2019-10-11 NOTE — Telephone Encounter (Signed)
Set this up 

## 2019-10-14 ENCOUNTER — Other Ambulatory Visit: Payer: Self-pay

## 2019-10-14 DIAGNOSIS — R000 Tachycardia, unspecified: Secondary | ICD-10-CM

## 2019-10-14 DIAGNOSIS — R002 Palpitations: Secondary | ICD-10-CM

## 2019-10-14 NOTE — Progress Notes (Signed)
even

## 2019-10-14 NOTE — Telephone Encounter (Signed)
Order was place. Please co-sign. KH

## 2019-10-19 ENCOUNTER — Ambulatory Visit (INDEPENDENT_AMBULATORY_CARE_PROVIDER_SITE_OTHER): Payer: BC Managed Care – PPO

## 2019-10-19 DIAGNOSIS — R000 Tachycardia, unspecified: Secondary | ICD-10-CM

## 2019-10-19 DIAGNOSIS — R002 Palpitations: Secondary | ICD-10-CM | POA: Diagnosis not present

## 2019-10-21 ENCOUNTER — Other Ambulatory Visit: Payer: Self-pay | Admitting: Chiropractic Medicine

## 2019-10-21 DIAGNOSIS — M545 Low back pain, unspecified: Secondary | ICD-10-CM

## 2019-10-24 ENCOUNTER — Ambulatory Visit
Admission: RE | Admit: 2019-10-24 | Discharge: 2019-10-24 | Disposition: A | Discharge status: Home / Self Care | Payer: BC Managed Care – PPO | Source: Ambulatory Visit | Attending: Chiropractic Medicine | Admitting: Chiropractic Medicine

## 2019-10-24 DIAGNOSIS — M545 Low back pain, unspecified: Secondary | ICD-10-CM

## 2019-10-24 DIAGNOSIS — M542 Cervicalgia: Secondary | ICD-10-CM

## 2019-10-25 ENCOUNTER — Other Ambulatory Visit: Payer: BC Managed Care – PPO

## 2019-10-29 LAB — RABIES NEUT.ABS TITRAT.(RFFIT)

## 2019-11-15 NOTE — Progress Notes (Signed)
CARDIOLOGY CONSULT NOTE       Patient ID: [name] MRN: [id number] DOB/AGE: 09/14/1975 44 y.o.  Admit date: (Not on file) Referring Physician: Redmond School Primary Physician: Denita Lung, MD Primary Cardiologist: New Reason for Consultation: Tachycardia  Active Problems:   * No active hospital problems. *   HPI:  44 y.o. referred by Dr Redmond School for tachycardia History of previous gastric banding and lumbar back surgery . Seen by PA Chana Bode 10/02/19 Complains of fast HR several times this year. Some triggers seem to have included COVID vaccine, anxiety and muscle relaxer. Associated sensation of chill, lightheadedness Pulse can be as high as 130-140 bpm she has pulse ox to check. She denies excess ETOH, stimulants She is non smoker She works at an [employer] Cardiac event monitor was ordered 10/14/19 does not appear to have been done Labs on 10/02/19 ok including TSH Pulse during office visit only 83 bpm ECG 10/02/19 was normal sinus with no pre excitation or other abnormalities   Monitor 11/18/19 NSR no arrhythmia even with symptoms  Echo pending  Has two teenage sons at [place] [employer]   ROS All other systems reviewed and negative except as noted above  Past Medical History:  Diagnosis Date  . Allergy   . Arthritis   . Chronic lower back pain    SCIATIC PAIN  . GERD (gastroesophageal reflux disease)   . GERD (gastroesophageal reflux disease)   . Headache(784.0)   . Joint pain   . Morbid obesity ([place])   . Plantar fasciitis   . Sinus congestion     Family History  Problem Relation Age of Onset  . Depression Mother   . Arthritis Mother   . Diabetes Father   . Hypertension Father   . Arthritis Brother   . Arthritis Maternal Grandmother   . Cancer Maternal Grandmother        cervical  . Heart disease Maternal Grandmother   . Hypertension Maternal Grandmother   . Cancer Maternal Grandfather   . Arthritis Paternal Grandmother   . Cancer  Paternal Grandmother   . Hypertension Paternal Grandfather   . Stroke Paternal Grandfather     Social History   Socioeconomic History  . Marital status: Married    Spouse name: Not on file  . Number of children: Not on file  . Years of education: Not on file  . Highest education level: Not on file  Occupational History  . Not on file  Tobacco Use  . Smoking status: Never Smoker  . Smokeless tobacco: Never Used  Substance and Sexual Activity  . Alcohol use: Yes    Comment: OCCASSIONALLY  . Drug use: No  . Sexual activity: Not on file  Other Topics Concern  . Not on file  Social History Narrative  . Not on file   Social Determinants of Health   Financial Resource Strain:   . Difficulty of Paying Living Expenses: Not on file  Food Insecurity:   . Worried About Charity fundraiser in the Last Year: Not on file  . Ran Out of Food in the Last Year: Not on file  Transportation Needs:   . Lack of Transportation (Medical): Not on file  . Lack of Transportation (Non-Medical): Not on file  Physical Activity:   . Days of Exercise per Week: Not on file  . Minutes of Exercise per Session: Not on file  Stress:   . Feeling of Stress : Not  on file  Social Connections:   . Frequency of Communication with Friends and Family: Not on file  . Frequency of Social Gatherings with Friends and Family: Not on file  . Attends Religious Services: Not on file  . Active Member of Clubs or Organizations: Not on file  . Attends Banker Meetings: Not on file  . Marital Status: Not on file  Intimate Partner Violence:   . Fear of Current or Ex-Partner: Not on file  . Emotionally Abused: Not on file  . Physically Abused: Not on file  . Sexually Abused: Not on file    Past Surgical History:  Procedure Laterality Date  . BREATH TEK H PYLORI  08/08/2011   Procedure: BREATH TEK H PYLORI;  Surgeon: Valarie Merino, MD;  Location: [facility];  Service: General;  Laterality: N/A;  715    . CESAREAN SECTION  03/15/04 & 04/27/07  . HIATAL HERNIA REPAIR  10/18/2011   Procedure: LAPAROSCOPIC REPAIR OF HIATAL HERNIA;  Surgeon: Valarie Merino, MD;  Location: [facility];  Service: General;  Laterality: N/A;  . LAPAROSCOPIC GASTRIC BANDING  10/18/2011   Procedure: LAPAROSCOPIC GASTRIC BANDING;  Surgeon: Valarie Merino, MD;  Location: [facility];  Service: General;  Laterality: N/A;  . LUMBAR DISC SURGERY  06/2004, 08/2010   L5-S, laminectomy/disection  . UPPER GASTROINTESTINAL ENDOSCOPY  07/01/2009   JACOBS MD      Current Outpatient Medications:  .  esomeprazole (NEXIUM) 40 MG capsule, Take 40 mg by mouth in the morning and at bedtime., Disp: , Rfl:  .  Magnesium 400 MG CAPS, Take 1 capsule by mouth daily. , Disp: , Rfl:  .  morphine (MS CONTIN) 30 MG 12 hr tablet, SMARTSIG:1 Tablet(s) By Mouth Every 12 Hours, Disp: , Rfl:  .  naloxone (NARCAN) 4 MG/0.1ML LIQD nasal spray kit, Narcan 4 mg/actuation nasal spray  TAKE BY NASAL ROUTE EVERY 3 MINUTES UNTIL PATIENT AWAKES OR EMS ARRIVES., Disp: , Rfl:  .  nitrofurantoin, macrocrystal-monohydrate, (MACROBID) 100 MG capsule, Use as needed after sexual activity., Disp: 20 capsule, Rfl: 3 .  Omega-3 Fatty Acids (OMEGA 3 PO), Take 3 capsules by mouth daily. , Disp: , Rfl:  .  OXYCONTIN 30 MG TB12, 3 (three) times daily as needed. , Disp: , Rfl:  .  VITAMIN D PO, Take 400 mg by mouth daily., Disp: , Rfl:     Physical Exam: Blood pressure 134/86, pulse 66, height 6\' 1"  (1.854 m), weight 254 lb (115.2 kg), SpO2 98 %.   Affect appropriate Overweight white female  HEENT: normal Neck supple with no adenopathy JVP normal no bruits no thyromegaly Lungs clear with no wheezing and good diaphragmatic motion Heart:  S1/S2 no murmur, no rub, gallop or click PMI normal Abdomen: benighn, BS positve, no tenderness, no AAA no bruit.  No HSM or HJR Distal pulses intact with no bruits No edema Neuro non-focal Skin warm and dry No muscular  weakness   Labs:   Lab Results  Component Value Date   WBC 7.2 10/02/2019   HGB 11.2 10/02/2019   HCT 35.9 10/02/2019   MCV 84 10/02/2019   PLT 370 10/02/2019    Recent Labs  Lab 11/28/19 1013  NA 142  K 4.4  CL 102  CO2 24  BUN 17  CREATININE 0.79  CALCIUM 9.0  PROT 7.5  BILITOT 0.4  ALKPHOS 86  ALT 9  AST 14  GLUCOSE 105*   No results found for: CKTOTAL,  CKMB, CKMBINDEX, TROPONINI  Lab Results  Component Value Date   CHOL 176 06/29/2017   Lab Results  Component Value Date   HDL 79 06/29/2017   Lab Results  Component Value Date   LDLCALC 89 06/29/2017   Lab Results  Component Value Date   TRIG 41 06/29/2017   Lab Results  Component Value Date   CHOLHDL 2.2 06/29/2017   No results found for: LDLDIRECT    Radiology: Cardiac event monitor  Result Date: 11/24/2019 NSR No significant arrhythmias even with all symptoms reported   EKG: 10/02/19 NSR normal ECG 11/29/19 SR rate 66 normal    ASSESSMENT AND PLAN:   1. Palpitations/Tachycardia:  Seems benign event monitor pending Echo to r/o structural heart disease. Labs ok PRN inderal 10 mg ordered  2. Obesity:  Post lap band discussed low carb diet and exercise   Will see PRN if echo normal   Signed: Jenkins Rouge 11/29/2019, 10:16 AM

## 2019-11-18 ENCOUNTER — Telehealth: Payer: Self-pay

## 2019-11-18 DIAGNOSIS — R000 Tachycardia, unspecified: Secondary | ICD-10-CM

## 2019-11-18 NOTE — Telephone Encounter (Signed)
-----   Message from Wendall Stade, MD sent at 11/15/2019  9:39 AM EDT ----- Has she ever gotten her monitor for tachcyardia ?? Ordered by primary Can order echo for palpitations

## 2019-11-18 NOTE — Telephone Encounter (Signed)
Left message for patient to call back. Will ask her about heart monitor. Will place order for echocardiogram.

## 2019-11-28 ENCOUNTER — Ambulatory Visit (INDEPENDENT_AMBULATORY_CARE_PROVIDER_SITE_OTHER): Payer: BC Managed Care – PPO | Admitting: Family Medicine

## 2019-11-28 ENCOUNTER — Encounter: Payer: Self-pay | Admitting: Family Medicine

## 2019-11-28 ENCOUNTER — Other Ambulatory Visit: Payer: Self-pay

## 2019-11-28 VITALS — BP 124/82 | HR 90 | Temp 98.0°F | Wt 264.0 lb

## 2019-11-28 DIAGNOSIS — R42 Dizziness and giddiness: Secondary | ICD-10-CM | POA: Diagnosis not present

## 2019-11-28 DIAGNOSIS — Z79899 Other long term (current) drug therapy: Secondary | ICD-10-CM

## 2019-11-28 DIAGNOSIS — R000 Tachycardia, unspecified: Secondary | ICD-10-CM | POA: Diagnosis not present

## 2019-11-28 DIAGNOSIS — R519 Headache, unspecified: Secondary | ICD-10-CM

## 2019-11-28 DIAGNOSIS — Z9884 Bariatric surgery status: Secondary | ICD-10-CM

## 2019-11-28 DIAGNOSIS — K219 Gastro-esophageal reflux disease without esophagitis: Secondary | ICD-10-CM

## 2019-11-28 NOTE — Progress Notes (Signed)
   Subjective:    Patient ID: [name], female    DOB: 1975-12-05, 44 y.o.   MRN: [id number]  HPI She is here for an acute visit for evaluation of abdominal pain however she had multiple other complaints.  She notes abdominal pain occurring with radiation to her left clavicle if she eats too much.  Does have previous history of LAP-BAND surgery.  She states that she has a 3-week history of dizziness that occurs after eating with any food and lasts roughly 20 minutes.  She does have a history of reflux disease and is taking omeprazole 40 mg twice daily and continues on that.  She has daily headaches and is planning to see neurology in mid October.  She also complains of tachycardia that usually occurs at night and can run between 90 and 120.  This also causes some difficulty with dizziness.  She has had previous EKG and Holter monitoring and is now scheduled to see cardiology.  She has a history of chronic low back pain and is on pain meds prescribed by Dr. Horald Chestnut.  Apparently she is having some difficulty with these medications and breakthrough pain.  When she increases the dosing she does not get more dizzy.   Review of Systems     Objective:   Physical Exam  Alert and in no distress. Tympanic membranes and canals are normal. Pharyngeal area is normal. Neck is supple without adenopathy or thyromegaly. Cardiac exam shows a regular sinus rhythm without murmurs or gallops. Lungs are clear to auscultation.  Abdominal exam shows no masses or tenderness with decreased bowel sounds.       Assessment & Plan:  Dizziness - Plan: Comprehensive metabolic panel  Tachycardia  Gastroesophageal reflux disease, unspecified whether esophagitis present - Plan: Comprehensive metabolic panel  Lapband APS August 2013 with post hiatus hernia repair - Plan: Comprehensive metabolic panel  Nonintractable headache, unspecified chronicity pattern, unspecified headache type  Encounter for long-term (current)  use of high-risk medication She will continue to be followed by pain management, continue on her present medication regimen.  She has an appointment to follow-up with cardiology concerning the tachycardia she is also scheduled to see neurology.  At this point there is no clear-cut reasoning behind the multitude of symptoms that she is having into a nice neat package.

## 2019-11-29 ENCOUNTER — Ambulatory Visit (INDEPENDENT_AMBULATORY_CARE_PROVIDER_SITE_OTHER): Payer: BC Managed Care – PPO | Admitting: Cardiovascular Disease

## 2019-11-29 ENCOUNTER — Encounter: Payer: Self-pay | Admitting: Cardiovascular Disease

## 2019-11-29 VITALS — BP 134/86 | HR 66 | Ht 73.0 in | Wt 254.0 lb

## 2019-11-29 DIAGNOSIS — R002 Palpitations: Secondary | ICD-10-CM

## 2019-11-29 LAB — COMPREHENSIVE METABOLIC PANEL
ALT: 9 IU/L (ref 0–32)
AST: 14 IU/L (ref 0–40)
Albumin/Globulin Ratio: 1.3 (ref 1.2–2.2)
Albumin: 4.3 g/dL (ref 3.8–4.8)
Alkaline Phosphatase: 86 IU/L (ref 44–121)
BUN/Creatinine Ratio: 22 (ref 9–23)
BUN: 17 mg/dL (ref 6–24)
Bilirubin Total: 0.4 mg/dL (ref 0.0–1.2)
CO2: 24 mmol/L (ref 20–29)
Calcium: 9 mg/dL (ref 8.7–10.2)
Chloride: 102 mmol/L (ref 96–106)
Creatinine, Ser: 0.79 mg/dL (ref 0.57–1.00)
GFR calc Af Amer: 105 mL/min/{1.73_m2} (ref >59)
GFR calc non Af Amer: 91 mL/min/{1.73_m2} (ref >59)
Globulin, Total: 3.2 g/dL (ref 1.5–4.5)
Glucose: 105 mg/dL — ABNORMAL HIGH (ref 65–99)
Potassium: 4.4 mmol/L (ref 3.5–5.2)
Sodium: 142 mmol/L (ref 134–144)
Total Protein: 7.5 g/dL (ref 6.0–8.5)

## 2019-11-29 MED ORDER — PROPRANOLOL HCL 10 MG PO TABS: 10.0000 mg | ORAL_TABLET | Freq: Two times a day (BID) | ORAL | 3 refills | Status: DC | PRN

## 2019-11-29 NOTE — Patient Instructions (Addendum)
Medication Instructions:  Your physician has recommended you make the following change in your medication:  1-START Inderal 10 mg by mouth daily as needed for palpitations.  *If you need a refill on your cardiac medications before your next appointment, please call your pharmacy*  Lab Work: If you have labs (blood work) drawn today and your tests are completely normal, you will receive your results only by: Marland Kitchen MyChart Message (if you have MyChart) OR . A paper copy in the mail If you have any lab test that is abnormal or we need to change your treatment, we will call you to review the results.  Testing/Procedures: Your physician has requested that you have an echocardiogram. Echocardiography is a painless test that uses sound waves to create images of your heart. It provides your doctor with information about the size and shape of your heart and how well your heart's chambers and valves are working. This procedure takes approximately one hour. There are no restrictions for this procedure.  Follow-Up: At [facility], you and your health needs are our priority.  As part of our continuing mission to provide you with exceptional heart care, we have created designated Provider Care Teams.  These Care Teams include your primary Cardiologist (physician) and Advanced Practice Providers (APPs -  Physician Assistants and Nurse Practitioners) who all work together to provide you with the care you need, when you need it.  We recommend signing up for the patient portal called "MyChart".  Sign up information is provided on this After Visit Summary.  MyChart is used to connect with patients for Virtual Visits (Telemedicine).  Patients are able to view lab/test results, encounter notes, upcoming appointments, etc.  Non-urgent messages can be sent to your provider as well.   To learn more about what you can do with MyChart, go to [email or web address].    Your next appointment:   As needed  The format for  your next appointment:   In Person  Provider:   You may see Dr. Eden Emms or one of the following Advanced Practice Providers on your designated Care Team:    Norma Fredrickson, NP  Nada Boozer, NP  Georgie Chard, NP

## 2019-11-30 ENCOUNTER — Encounter: Payer: Self-pay | Admitting: Family Medicine

## 2019-12-02 NOTE — Telephone Encounter (Signed)
Patient seen in office on 11/29/19. Patient scheduled for echo.

## 2019-12-05 ENCOUNTER — Other Ambulatory Visit (HOSPITAL_COMMUNITY): Payer: BC Managed Care – PPO

## 2019-12-05 ENCOUNTER — Encounter: Payer: Self-pay | Admitting: Cardiology

## 2019-12-05 NOTE — Progress Notes (Unsigned)
Patient ID: [name], female   DOB: Nov 05, 1975, 44 y.o.   MRN: [id number]   Verified appointment "no show" status with  [name] at 11:22am.

## 2019-12-10 ENCOUNTER — Other Ambulatory Visit: Payer: Self-pay | Admitting: Family Medicine

## 2019-12-10 DIAGNOSIS — Z1231 Encounter for screening mammogram for malignant neoplasm of breast: Secondary | ICD-10-CM

## 2019-12-11 ENCOUNTER — Encounter: Payer: Self-pay | Admitting: *Deleted

## 2019-12-13 ENCOUNTER — Ambulatory Visit: Payer: BC Managed Care – PPO | Admitting: Diagnostic Neuroimaging

## 2019-12-13 ENCOUNTER — Other Ambulatory Visit: Payer: Self-pay

## 2019-12-13 ENCOUNTER — Encounter: Payer: Self-pay | Admitting: Diagnostic Neuroimaging

## 2019-12-13 VITALS — BP 116/79 | HR 76 | Ht 73.0 in | Wt 266.2 lb

## 2019-12-13 DIAGNOSIS — R4189 Other symptoms and signs involving cognitive functions and awareness: Secondary | ICD-10-CM

## 2019-12-13 DIAGNOSIS — G43109 Migraine with aura, not intractable, without status migrainosus: Secondary | ICD-10-CM

## 2019-12-13 DIAGNOSIS — H538 Other visual disturbances: Secondary | ICD-10-CM | POA: Diagnosis not present

## 2019-12-13 MED ORDER — TOPIRAMATE 50 MG PO TABS: 50.0000 mg | ORAL_TABLET | Freq: Two times a day (BID) | ORAL | 12 refills | Status: DC

## 2019-12-13 NOTE — Progress Notes (Signed)
[facility]  PATIENT: [name] DOB: 17-Jul-1975  REFERRING CLINICIAN: Denita Lung, MD HISTORY FROM: patient  REASON FOR VISIT: new consult    HISTORICAL  CHIEF COMPLAINT:  Chief Complaint  Patient presents with   Consult    Referred by Dr, Marin Comment at [facility] for increased dizziness, enlarged blind spot in left eye. States this has been going on for the past year.    Room 6    alone     HISTORY OF PRESENT ILLNESS:   44 year old female here for evaluation of enlarged blind spots.  Patient has had 4 to 6 months of dizziness, lightheadedness, tachycardia, headaches and brain fog symptoms.  Patient went optometry clinic and was diagnosed with slightly enlarged blind spots bilaterally and recommended for neurologic work-up.  No papilledema.  She has some generalized blurred vision.  Sometimes she has headaches with photophobia, floaters, seeing spots and sparkles.  Sometimes has nausea with her headaches.  Patient having 4 to 6 days of headaches per week.  Patient has had 5 to 10 years of chronic headaches, previously diagnosed as sinus headaches.  She would occasionally have throbbing and nausea with these headaches.  She went to ENT for several years for treatments.  No family history of headache or migraines.  No prior history of migraine diagnosis.  Patient also on chronic opioids for chronic low back pain.  She is under care of pain management specialist Dr. Jeanell Sparrow most.    REVIEW OF SYSTEMS: Full 14 system review of systems performed and negative with exception of: As per HPI.  ALLERGIES: Allergies  Allergen Reactions   Fluticasone Other (See Comments)    headache   Nsaids Nausea And Vomiting   Penicillins     HOME MEDICATIONS: Outpatient Medications Prior to Visit  Medication Sig Dispense Refill   esomeprazole (NEXIUM) 40 MG capsule Take 40 mg by mouth in the morning and at bedtime.     Magnesium 400 MG CAPS Take 1 capsule  by mouth daily.      morphine (MS CONTIN) 30 MG 12 hr tablet SMARTSIG:1 Tablet(s) By Mouth Every 12 Hours     naloxone (NARCAN) 4 MG/0.1ML LIQD nasal spray kit Narcan 4 mg/actuation nasal spray  TAKE BY NASAL ROUTE EVERY 3 MINUTES UNTIL PATIENT AWAKES OR EMS ARRIVES.     nitrofurantoin, macrocrystal-monohydrate, (MACROBID) 100 MG capsule Use as needed after sexual activity. 20 capsule 3   Omega-3 Fatty Acids (OMEGA 3 PO) Take 3 capsules by mouth daily.      OXYCONTIN 30 MG TB12 3 (three) times daily as needed.      propranolol (INDERAL) 10 MG tablet Take 1 tablet (10 mg total) by mouth 3 times/day as needed-between meals & bedtime. 30 tablet 3   VITAMIN D PO Take 400 mg by mouth daily.     No facility-administered medications prior to visit.    PAST MEDICAL HISTORY: Past Medical History:  Diagnosis Date   Allergy    Arthritis    Chronic lower back pain    SCIATIC PAIN   GERD (gastroesophageal reflux disease)    Headache(784.0)    Joint pain    Morbid obesity (HCC)    Plantar fasciitis    Sinus congestion    Tachycardia     PAST SURGICAL HISTORY: Past Surgical History:  Procedure Laterality Date   BREATH TEK H PYLORI  08/08/2011   Procedure: BREATH TEK H PYLORI;  Surgeon: Pedro Earls, MD;  Location: [facility];  Service: General;  Laterality: N/A;  [place]  03/15/04 & 04/27/07   HIATAL HERNIA REPAIR  10/18/2011   Procedure: LAPAROSCOPIC REPAIR OF HIATAL HERNIA;  Surgeon: Pedro Earls, MD;  Location: [facility];  Service: General;  Laterality: N/A;   LAPAROSCOPIC GASTRIC BANDING  10/18/2011   Procedure: LAPAROSCOPIC GASTRIC BANDING;  Surgeon: Pedro Earls, MD;  Location: [facility];  Service: General;  Laterality: N/A;   [place] [facility]  06/2004, 08/2010   L5-S, laminectomy/disection   UPPER GASTROINTESTINAL ENDOSCOPY  07/01/2009   JACOBS MD    FAMILY HISTORY: Family History  Problem Relation Age of Onset   Depression Mother     Arthritis Mother    Diabetes Father    Hypertension Father    Arthritis Brother    Arthritis Maternal Grandmother    Cancer Maternal Grandmother        cervical   Heart disease Maternal Grandmother    Hypertension Maternal Grandmother    Cancer Maternal Grandfather    Arthritis Paternal Grandmother    Cancer Paternal Grandmother    Hypertension Paternal Grandfather    Stroke Paternal Grandfather     SOCIAL HISTORY: Social History   Socioeconomic History   Marital status: Married    Spouse name: Tommi Rumps   Number of children: Not on file   Years of education: Not on file   Highest education level: Not on file  Occupational History   Not on file  Tobacco Use   Smoking status: Never Smoker   Smokeless tobacco: Never Used  Substance and Sexual Activity   Alcohol use: Yes    Comment: OCCASSIONALLY   Drug use: No   Sexual activity: Not on file  Other Topics Concern   Not on file  Social History Narrative   Lives with spouse   Social Determinants of Health   Financial Resource Strain:    Difficulty of Paying Living Expenses: Not on file  Food Insecurity:    Worried About Estate manager/land agent of Food in the Last Year: Not on file   [employer] of Food in the Last Year: Not on file  Transportation Needs:    Lack of Transportation (Medical): Not on file   Lack of Transportation (Non-Medical): Not on file  Physical Activity:    Days of Exercise per Week: Not on file   Minutes of Exercise per Session: Not on file  Stress:    Feeling of Stress : Not on file  Social Connections:    Frequency of Communication with Friends and Family: Not on file   Frequency of Social Gatherings with Friends and Family: Not on file   Attends Religious Services: Not on file   Active Member of Clubs or Organizations: Not on file   Attends Archivist Meetings: Not on file   Marital Status: Not on file  Intimate Partner Violence:    Fear of Current or  Ex-Partner: Not on file   Emotionally Abused: Not on file   Physically Abused: Not on file   Sexually Abused: Not on file     PHYSICAL EXAM  GENERAL EXAM/CONSTITUTIONAL: Vitals:  Vitals:   12/13/19 0827  BP: 116/79  Pulse: 76  Weight: 266 lb 3.2 oz (120.7 kg)  Height: 6' 1" (1.854 m)     Body mass index is 35.12 kg/m. Wt Readings from Last 3 Encounters:  12/13/19 266 lb 3.2 oz (120.7 kg)  11/29/19 254 lb (115.2 kg)  11/28/19 264  lb (119.7 kg)     Patient is in no distress; well developed, nourished and groomed; neck is supple  CARDIOVASCULAR:  Examination of carotid arteries is normal; no carotid bruits  Regular rate and rhythm, no murmurs  Examination of peripheral vascular system by observation and palpation is normal  EYES:  Ophthalmoscopic exam of optic discs and posterior segments is normal; no papilledema or hemorrhages  No exam data present  MUSCULOSKELETAL:  Gait, strength, tone, movements noted in Neurologic exam below  NEUROLOGIC: MENTAL STATUS:  No flowsheet data found.  awake, alert, oriented to person, place and time  recent and remote memory intact  normal attention and concentration  language fluent, comprehension intact, naming intact  fund of knowledge appropriate  CRANIAL NERVE:   2nd - no papilledema on fundoscopic exam  2nd, 3rd, 4th, 6th - pupils equal and reactive to light, visual fields full to confrontation, extraocular muscles intact, no nystagmus  5th - facial sensation symmetric  7th - facial strength symmetric  8th - hearing intact  9th - palate elevates symmetrically, uvula midline  11th - shoulder shrug symmetric  12th - tongue protrusion midline  MOTOR:   normal bulk and tone, full strength in the BUE, BLE  SENSORY:   normal and symmetric to light touch, temperature, vibration  COORDINATION:   finger-nose-finger, fine finger movements normal  REFLEXES:   deep tendon reflexes present and  symmetric  GAIT/STATION:   narrow based gait     DIAGNOSTIC DATA (LABS, IMAGING, TESTING) - I reviewed patient records, labs, notes, testing and imaging myself where available.  Lab Results  Component Value Date   WBC 7.2 10/02/2019   HGB 11.2 10/02/2019   HCT 35.9 10/02/2019   MCV 84 10/02/2019   PLT 370 10/02/2019      Component Value Date/Time   NA 142 11/28/2019 1013   K 4.4 11/28/2019 1013   CL 102 11/28/2019 1013   CO2 24 11/28/2019 1013   GLUCOSE 105 (H) 11/28/2019 1013   GLUCOSE 84 08/20/2014 0001   BUN 17 11/28/2019 1013   CREATININE 0.79 11/28/2019 1013   CREATININE 0.89 08/20/2014 0001   CALCIUM 9.0 11/28/2019 1013   PROT 7.5 11/28/2019 1013   ALBUMIN 4.3 11/28/2019 1013   AST 14 11/28/2019 1013   ALT 9 11/28/2019 1013   ALKPHOS 86 11/28/2019 1013   BILITOT 0.4 11/28/2019 1013   GFRNONAA 91 11/28/2019 1013   GFRAA 105 11/28/2019 1013   Lab Results  Component Value Date   CHOL 176 06/29/2017   HDL 79 06/29/2017   LDLCALC 89 06/29/2017   TRIG 41 06/29/2017   CHOLHDL 2.2 06/29/2017   No results found for: HGBA1C No results found for: VITAMINB12 Lab Results  Component Value Date   TSH 1.440 10/02/2019    10/24/19 MRI brain [I reviewed images myself and agree with interpretation. -VRP]  - No acute abnormality. Several small hyperintensities in the frontal white matter bilaterally. Differential includes small vessel ischemia and chronic complex migraine headaches. Pattern not typical for demyelinating disease.    ASSESSMENT AND PLAN  44 y.o. year old female here with constellation of symptoms including dizziness, lightheadedness, tachycardia, headaches, blurred vision, brain fog since earlier 2021.  Was found to have enlarged blind spots on visual field testing.     Dx:  1. Blurred vision   2. Brain fog   3. Migraine with aura and without status migrainosus, not intractable      PLAN:  ENLARGED BLIND SPOTS ON  VISUAL FIELD TESTING  (unclear etiology; MRI brain unremarkable) - follow up with ophthalmology; consider second opinion neuro-ophthalmology - may consider lumbar puncture for opening pressure to evaluate for idiopathic intracranial hypertension (pseudotumor cerebri)   HEADACHES (likely migraine + possible medication overuse HA) - consider migraine treatments (topiramate, triptans, CGRP antagonists) - start topiramate 39m at bedtime; after 1 week increase to twice a day; drink plenty of water  Meds ordered this encounter  Medications   topiramate (TOPAMAX) 50 MG tablet    Sig: Take 1 tablet (50 mg total) by mouth 2 (two) times daily.    Dispense:  60 tablet    Refill:  12   Return in 6 months (on 06/12/2020) for pending if symptoms worsen or fail to improve, with NP (Amy Lomax).    VPenni Bombard MD 102/54/2706 82:37AM Certified in Neurology, Neurophysiology and Neuroimaging  [facility] [address] [place]ast [place] [phone]

## 2019-12-13 NOTE — Patient Instructions (Addendum)
ENLARGED BLIND SPOT ON VISUAL FIELD TESTING - follow up with ophthalmology; consider second opinion - may consider lumbar puncture for opening pressure  HEADACHES (likely migraine + medication overuse HA) - consider migraine treatments (topiramate, triptans, CGRP antagonists) - start topiramate 50mg  at bedtime; after 1 week increase to twice a day; drink plenty of water

## 2019-12-26 ENCOUNTER — Other Ambulatory Visit: Payer: Self-pay

## 2019-12-26 ENCOUNTER — Ambulatory Visit (HOSPITAL_COMMUNITY): Payer: BC Managed Care – PPO | Attending: Cardiovascular Disease

## 2019-12-26 DIAGNOSIS — R000 Tachycardia, unspecified: Secondary | ICD-10-CM | POA: Diagnosis not present

## 2019-12-26 LAB — ECHOCARDIOGRAM COMPLETE
Area-P 1/2: 3.31 cm2
S' Lateral: 2.9 cm

## 2019-12-26 MED ORDER — PERFLUTREN LIPID MICROSPHERE
1.0000 mL | INTRAVENOUS | Status: AC | PRN
  Administered 2019-12-26: 2 mL via INTRAVENOUS

## 2020-01-07 ENCOUNTER — Telehealth: Payer: Self-pay | Admitting: Medical

## 2020-01-07 NOTE — Telephone Encounter (Signed)
I finally received sleep study results.   No evidence of significant sleep apnea.  Follow up with Dr. Susann Givens, PCP if ongoing concerns, symptoms related to this study.

## 2020-01-07 NOTE — Telephone Encounter (Signed)
Lvm for pt to call back and schedule a follow up appointment with Dr. Susann Givens per PA Tysinger. Kh

## 2020-01-08 NOTE — Telephone Encounter (Signed)
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 KH

## 2020-01-09 ENCOUNTER — Encounter: Payer: Self-pay | Admitting: Family Medicine

## 2020-01-16 ENCOUNTER — Ambulatory Visit: Payer: BC Managed Care – PPO

## 2020-01-16 ENCOUNTER — Ambulatory Visit
Admission: RE | Admit: 2020-01-16 | Discharge: 2020-01-16 | Disposition: A | Discharge status: Home / Self Care | Payer: BC Managed Care – PPO | Source: Ambulatory Visit | Attending: Family Medicine | Admitting: Family Medicine

## 2020-01-16 ENCOUNTER — Other Ambulatory Visit: Payer: Self-pay

## 2020-01-16 DIAGNOSIS — Z1231 Encounter for screening mammogram for malignant neoplasm of breast: Secondary | ICD-10-CM

## 2020-01-30 ENCOUNTER — Encounter: Payer: Self-pay | Admitting: Family Medicine

## 2020-02-10 ENCOUNTER — Telehealth: Payer: Self-pay | Admitting: Family Medicine

## 2020-02-10 NOTE — Telephone Encounter (Signed)
Pt called back again to check the status on this

## 2020-02-10 NOTE — Telephone Encounter (Signed)
Help her get this set up.

## 2020-02-10 NOTE — Telephone Encounter (Signed)
Called infusion clinic LM to set up for covid antibodies infusion. Pt. Aware they will be contacting her in the next few days.

## 2020-02-10 NOTE — Telephone Encounter (Signed)
Pt called and states that she is very sick, She has tested positive for COVID. She would like to get antibody infusion. She would like to know if we can help her with that. Please advise pt at [phone].

## 2020-02-11 ENCOUNTER — Encounter (HOSPITAL_COMMUNITY): Payer: Self-pay

## 2020-02-11 ENCOUNTER — Telehealth: Payer: Self-pay | Admitting: Oncology

## 2020-02-11 ENCOUNTER — Encounter: Payer: Self-pay | Admitting: Oncology

## 2020-02-11 ENCOUNTER — Telehealth (HOSPITAL_COMMUNITY): Payer: Self-pay

## 2020-02-11 ENCOUNTER — Ambulatory Visit (HOSPITAL_COMMUNITY)
Admission: RE | Admit: 2020-02-11 | Discharge: 2020-02-11 | Disposition: A | Discharge status: Home / Self Care | Payer: BC Managed Care – PPO | Source: Ambulatory Visit | Attending: Pulmonary Disease | Admitting: Pulmonary Disease

## 2020-02-11 ENCOUNTER — Other Ambulatory Visit: Payer: Self-pay | Admitting: Oncology

## 2020-02-11 DIAGNOSIS — U071 COVID-19: Secondary | ICD-10-CM

## 2020-02-11 MED ORDER — EPINEPHRINE 0.3 MG/0.3ML IJ SOAJ: 0.3000 mg | Freq: Once | INTRAMUSCULAR | Status: DC | PRN

## 2020-02-11 MED ORDER — DIPHENHYDRAMINE HCL 50 MG/ML IJ SOLN: 50.0000 mg | Freq: Once | INTRAMUSCULAR | Status: DC | PRN

## 2020-02-11 MED ORDER — SODIUM CHLORIDE 0.9 % IV SOLN: INTRAVENOUS | Status: DC | PRN

## 2020-02-11 MED ORDER — ALBUTEROL SULFATE HFA 108 (90 BASE) MCG/ACT IN AERS: 2.0000 | INHALATION_SPRAY | Freq: Once | RESPIRATORY_TRACT | Status: DC | PRN

## 2020-02-11 MED ORDER — SODIUM CHLORIDE 0.9 % IV SOLN: Freq: Once | INTRAVENOUS | Status: AC

## 2020-02-11 MED ORDER — METHYLPREDNISOLONE SODIUM SUCC 125 MG IJ SOLR: 125.0000 mg | Freq: Once | INTRAMUSCULAR | Status: DC | PRN

## 2020-02-11 MED ORDER — FAMOTIDINE IN NACL 20-0.9 MG/50ML-% IV SOLN: 20.0000 mg | Freq: Once | INTRAVENOUS | Status: DC | PRN

## 2020-02-11 NOTE — Telephone Encounter (Signed)
Called to Discuss with patient about Covid symptoms and the use of the monoclonal antibody infusion for those with mild to moderate Covid symptoms and at a high risk of hospitalization.     Pt appears to qualify for this infusion due to co-morbid conditions and/or a member of an at-risk group in accordance with the FDA Emergency Use Authorization.    Pt stated that her symptoms started on 12/10, she tested positive with 3 home tests on 12/12, and complains of cough, congestion, low grade fever, headache, and body aches. She states her ht is 6' and weight is 265#, BMI calculated is 35.9. Pt informed an APP will be calling to verify information and confirm appointment for today at 4:30pm.

## 2020-02-11 NOTE — Discharge Instructions (Signed)
10 Things You Can Do to Manage Your COVID-19 Symptoms at Home If you have possible or confirmed COVID-19: 1. Stay home from work and school. And stay away from other public places. If you must go out, avoid using any kind of public transportation, ridesharing, or taxis. 2. Monitor your symptoms carefully. If your symptoms get worse, call your healthcare provider immediately. 3. Get rest and stay hydrated. 4. If you have a medical appointment, call the healthcare provider ahead of time and tell them that you have or may have COVID-19. 5. For medical emergencies, call 911 and notify the dispatch personnel that you have or may have COVID-19. 6. Cover your cough and sneezes with a tissue or use the inside of your elbow. 7. Wash your hands often with soap and water for at least 20 seconds or clean your hands with an alcohol-based hand sanitizer that contains at least 60% alcohol. 8. As much as possible, stay in a specific room and away from other people in your home. Also, you should use a separate bathroom, if available. If you need to be around other people in or outside of the home, wear a mask. 9. Avoid sharing personal items with other people in your household, like dishes, towels, and bedding. 10. Clean all surfaces that are touched often, like counters, tabletops, and doorknobs. Use household cleaning sprays or wipes according to the label instructions. cdc.gov/coronavirus 08/29/2018 This information is not intended to replace advice given to you by your health care provider. Make sure you discuss any questions you have with your health care provider. Document Revised: 01/31/2019 Document Reviewed: 01/31/2019 Elsevier Patient Education  2020 Elsevier Inc. What types of side effects do monoclonal antibody drugs cause?  Common side effects  In general, the more common side effects caused by monoclonal antibody drugs include: . Allergic reactions, such as hives or itching . Flu-like signs and  symptoms, including chills, fatigue, fever, and muscle aches and pains . Nausea, vomiting . Diarrhea . Skin rashes . Low blood pressure   The CDC is recommending patients who receive monoclonal antibody treatments wait at least 90 days before being vaccinated.  Currently, there are no data on the safety and efficacy of mRNA COVID-19 vaccines in persons who received monoclonal antibodies or convalescent plasma as part of COVID-19 treatment. Based on the estimated half-life of such therapies as well as evidence suggesting that reinfection is uncommon in the 90 days after initial infection, vaccination should be deferred for at least 90 days, as a precautionary measure until additional information becomes available, to avoid interference of the antibody treatment with vaccine-induced immune responses. If you have any questions or concerns after the infusion please call the Advanced Practice Provider on call at [phone]. This number is ONLY intended for your use regarding questions or concerns about the infusion post-treatment side-effects.  Please do not provide this number to others for use. For return to work notes please contact your primary care provider.   If someone you know is interested in receiving treatment please have them call the COVID hotline at [phone].   

## 2020-02-11 NOTE — Progress Notes (Signed)
Patient reviewed Fact Sheet for Patients, Parents, and Caregivers for Emergency Use Authorization (EUA) of  Bamlanivimab & Etesevimab for the Treatment of Coronavirus. Patient also reviewed and is agreeable to the estimated cost of treatment. Patient is agreeable to proceed.   

## 2020-02-11 NOTE — Telephone Encounter (Signed)
I connected by phone with  Mrs. Santiesteban to discuss the potential use of an new treatment for mild to moderate COVID-19 viral infection in non-hospitalized patients.   This patient is a age/sex that meets the FDA criteria for Emergency Use Authorization of casirivimab\imdevimab.  Has a (+) direct SARS-CoV-2 viral test result 1. Has mild or moderate COVID-19  2. Is ? 44 years of age and weighs ? 40 kg 3. Is NOT hospitalized due to COVID-19 4. Is NOT requiring oxygen therapy or requiring an increase in baseline oxygen flow rate due to COVID-19 5. Is within 10 days of symptom onset 6. Has at least one of the high risk factor(s) for progression to severe COVID-19 and/or hospitalization as defined in EUA. Specific high risk criteria : Past Medical History:  Diagnosis Date  . Allergy   . Arthritis   . Chronic lower back pain    SCIATIC PAIN  . GERD (gastroesophageal reflux disease)   . Headache(784.0)   . Joint pain   . Morbid obesity (HCC)   . Plantar fasciitis   . Sinus congestion   . Tachycardia   ?   Symptom onset  02/09/20   I have spoken and communicated the following to the patient or parent/caregiver:   1. FDA has authorized the emergency use of casirivimab\imdevimab for the treatment of mild to moderate COVID-19 in adults and pediatric patients with positive results of direct SARS-CoV-2 viral testing who are 44 years of age and older weighing at least 40 kg, and who are at high risk for progressing to severe COVID-19 and/or hospitalization.   2. The significant known and potential risks and benefits of casirivimab\imdevimab, and the extent to which such potential risks and benefits are unknown.   3. Information on available alternative treatments and the risks and benefits of those alternatives, including clinical trials.   4. Patients treated with casirivimab\imdevimab should continue to self-isolate and use infection control measures (e.g., wear mask, isolate, social distance,  avoid sharing personal items, clean and disinfect "high touch" surfaces, and frequent handwashing) according to CDC guidelines.    5. The patient or parent/caregiver has the option to accept or refuse casirivimab\imdevimab .   After reviewing this information with the patient, The patient agreed to proceed with receiving casirivimab\imdevimab infusion and will be provided a copy of the Fact sheet prior to receiving the infusion.Mignon Pine, AGNP-C [phone] (Infusion Center Hotline)

## 2020-02-11 NOTE — Progress Notes (Signed)
  Diagnosis: COVID-19  Physician: Dr. Wright  Procedure: Covid Infusion Clinic Med: bamlanivimab\etesevimab infusion - Provided patient with bamlanimivab\etesevimab fact sheet for patients, parents and caregivers prior to infusion.  Complications: No immediate complications noted.  Discharge: Discharged home   [name] J Kushi Kun 02/11/2020  

## 2020-02-14 ENCOUNTER — Encounter: Payer: Self-pay | Admitting: Family Medicine

## 2020-02-14 MED ORDER — NITROFURANTOIN MONOHYD MACRO 100 MG PO CAPS: ORAL_CAPSULE | ORAL | 3 refills | Status: AC

## 2020-02-14 NOTE — Telephone Encounter (Signed)
She continues to have difficulty with Covid symptoms versus continued difficulty with pain.  She is wondering how much of this is put topiramate.  She will cut back on the topiramate for a day or 2 to see what it would do.  I also renewed her Macrobid that she uses for post sexual activity prophylaxis for UTI

## 2020-02-18 ENCOUNTER — Encounter: Payer: Self-pay | Admitting: Family Medicine

## 2020-02-19 ENCOUNTER — Ambulatory Visit: Payer: BC Managed Care – PPO | Admitting: Family Medicine

## 2020-02-19 ENCOUNTER — Other Ambulatory Visit: Payer: Self-pay

## 2020-02-19 ENCOUNTER — Encounter: Payer: Self-pay | Admitting: Family Medicine

## 2020-02-19 DIAGNOSIS — M791 Myalgia, unspecified site: Secondary | ICD-10-CM | POA: Diagnosis not present
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
## 2020-03-12 ENCOUNTER — Encounter: Payer: BC Managed Care – PPO | Admitting: Family Medicine

## 2020-03-27 ENCOUNTER — Encounter: Payer: Self-pay | Admitting: Family Medicine

## 2020-04-09 ENCOUNTER — Ambulatory Visit: Payer: BC Managed Care – PPO | Admitting: Family Medicine

## 2020-04-09 ENCOUNTER — Encounter: Payer: Self-pay | Admitting: Family Medicine

## 2020-04-09 ENCOUNTER — Other Ambulatory Visit: Payer: Self-pay

## 2020-04-09 VITALS — BP 122/80 | HR 71 | Temp 97.6°F | Ht 72.0 in | Wt 274.4 lb

## 2020-04-09 DIAGNOSIS — R000 Tachycardia, unspecified: Secondary | ICD-10-CM

## 2020-04-09 DIAGNOSIS — R42 Dizziness and giddiness: Secondary | ICD-10-CM | POA: Diagnosis not present

## 2020-04-09 DIAGNOSIS — Z0000 Encounter for general adult medical examination without abnormal findings: Secondary | ICD-10-CM | POA: Diagnosis not present

## 2020-04-09 DIAGNOSIS — K219 Gastro-esophageal reflux disease without esophagitis: Secondary | ICD-10-CM | POA: Diagnosis not present

## 2020-04-09 DIAGNOSIS — M542 Cervicalgia: Secondary | ICD-10-CM | POA: Diagnosis not present

## 2020-04-09 DIAGNOSIS — Z23 Encounter for immunization: Secondary | ICD-10-CM

## 2020-04-10 LAB — LIPID PANEL
Chol/HDL Ratio: 2.5 ratio (ref 0.0–4.4)
Cholesterol, Total: 178 mg/dL (ref 100–199)
HDL: 71 mg/dL (ref >39)
LDL Chol Calc (NIH): 97 mg/dL (ref 0–99)
Triglycerides: 50 mg/dL (ref 0–149)
VLDL Cholesterol Cal: 10 mg/dL (ref 5–40)

## 2020-04-10 LAB — BARTONELLA ANTIBODY PANEL
B Quintana IgM: NEGATIVE titer
B henselae IgG: NEGATIVE titer
B henselae IgM: NEGATIVE titer
B quintana IgG: NEGATIVE titer
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
## 2020-05-06 ENCOUNTER — Telehealth: Payer: Self-pay | Admitting: Family Medicine

## 2020-05-06 NOTE — Telephone Encounter (Signed)
Call facility in reference to a requested for medical records. No pap since 2016

## 2020-05-25 ENCOUNTER — Other Ambulatory Visit (HOSPITAL_BASED_OUTPATIENT_CLINIC_OR_DEPARTMENT_OTHER): Payer: Self-pay | Admitting: Student

## 2020-05-25 ENCOUNTER — Other Ambulatory Visit (HOSPITAL_COMMUNITY): Payer: Self-pay | Admitting: Student

## 2020-05-25 DIAGNOSIS — R109 Unspecified abdominal pain: Secondary | ICD-10-CM

## 2020-05-28 ENCOUNTER — Ambulatory Visit (HOSPITAL_BASED_OUTPATIENT_CLINIC_OR_DEPARTMENT_OTHER)
Admission: RE | Admit: 2020-05-28 | Discharge: 2020-05-28 | Disposition: A | Discharge status: Home / Self Care | Payer: BC Managed Care – PPO | Source: Ambulatory Visit | Attending: Student | Admitting: Student

## 2020-05-28 ENCOUNTER — Encounter (HOSPITAL_COMMUNITY): Payer: Self-pay

## 2020-05-28 ENCOUNTER — Ambulatory Visit (HOSPITAL_COMMUNITY)
Admission: RE | Admit: 2020-05-28 | Discharge: 2020-05-28 | Disposition: A | Discharge status: Home / Self Care | Payer: BC Managed Care – PPO | Source: Ambulatory Visit | Attending: Student | Admitting: Student

## 2020-05-28 ENCOUNTER — Other Ambulatory Visit: Payer: Self-pay

## 2020-05-28 DIAGNOSIS — R109 Unspecified abdominal pain: Secondary | ICD-10-CM | POA: Insufficient documentation

## 2020-05-28 MED ORDER — IOHEXOL 300 MG/ML  SOLN
100.0000 mL | Freq: Once | INTRAMUSCULAR | Status: AC | PRN
  Administered 2020-05-28: 100 mL via INTRAVENOUS
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
## 2020-06-11 ENCOUNTER — Other Ambulatory Visit: Payer: Self-pay

## 2020-06-11 ENCOUNTER — Ambulatory Visit: Payer: BC Managed Care – PPO | Admitting: Cardiology

## 2020-06-11 ENCOUNTER — Encounter: Payer: Self-pay | Admitting: Cardiology

## 2020-06-11 VITALS — BP 126/82 | HR 74 | Ht 72.0 in | Wt 270.0 lb

## 2020-06-11 DIAGNOSIS — R002 Palpitations: Secondary | ICD-10-CM

## 2020-06-11 DIAGNOSIS — R000 Tachycardia, unspecified: Secondary | ICD-10-CM | POA: Diagnosis not present

## 2020-06-11 DIAGNOSIS — I200 Unstable angina: Secondary | ICD-10-CM

## 2020-06-11 NOTE — Patient Instructions (Addendum)
Medication Instructions:  Your physician recommends that you continue on your current medications as directed. Please refer to the Current Medication list given to you today.  *If you need a refill on your cardiac medications before your next appointment, please call your pharmacy*   Lab Work: None ordered  If you have labs (blood work) drawn today and your tests are completely normal, you will receive your results only by: Marland Kitchen MyChart Message (if you have MyChart) OR . A paper copy in the mail If you have any lab test that is abnormal or we need to change your treatment, we will call you to review the results.   Testing/Procedures: Your physician has requested that you have en exercise stress myoview. For further information please visit [email or web address] Please follow instruction sheet, BELOW:    You are scheduled for a Myocardial Perfusion Imaging Study  Please arrive 15 minutes prior to your appointment time for registration and insurance purposes.   The test will take approximately 3 to 4 hours to complete; you may bring reading material.  If someone comes with you to your appointment, they will need to remain in the main lobby due to limited space in the testing area. **If you are pregnant or breastfeeding, please notify the nuclear lab prior to your appointment**  How to prepare for your Myocardial Perfusion Test: . Do not eat or drink 3 hours prior to your test, except you may have water. . Do not consume products containing caffeine (regular or decaffeinated) 12 hours prior to your test. (ex: coffee, chocolate, sodas, tea). . Do bring a list of your current medications with you.  If not listed below, you may take your medications as normal. . Do wear comfortable clothes (no dresses or overalls) and walking shoes, tennis shoes preferred (No heels or open toe shoes are allowed). . Do NOT wear cologne, perfume, aftershave, or lotions (deodorant is allowed). . If these  instructions are not followed, your test will have to be rescheduled.  You will need to be tested for covid prior to your exercise stress test.  They will let you know when / where to go.  You will need to quarantine once you are tested, until your stress test.     Your physician has requested that you have an echocardiogram. Echocardiography is a painless test that uses sound waves to create images of your heart. It provides your doctor with information about the size and shape of your heart and how well your heart's chambers and valves are working. This procedure takes approximately one hour. There are no restrictions for this procedure.     Follow-Up: At [facility], you and your health needs are our priority.  As part of our continuing mission to provide you with exceptional heart care, we have created designated Provider Care Teams.  These Care Teams include your primary Cardiologist (physician) and Advanced Practice Providers (APPs -  Physician Assistants and Nurse Practitioners) who all work together to provide you with the care you need, when you need it.  We recommend signing up for the patient portal called "MyChart".  Sign up information is provided on this After Visit Summary.  MyChart is used to connect with patients for Virtual Visits (Telemedicine).  Patients are able to view lab/test results, encounter notes, upcoming appointments, etc.  Non-urgent messages can be sent to your provider as well.   To learn more about what you can do with MyChart, go to [email or web address].    Your next appointment:  1 month(s)  The format for your next appointment:   In Person  Provider:    You may see Charlton Haws, MD    Other Instructions

## 2020-06-19 ENCOUNTER — Other Ambulatory Visit: Payer: Self-pay | Admitting: Surgery

## 2020-06-19 DIAGNOSIS — R109 Unspecified abdominal pain: Secondary | ICD-10-CM

## 2020-07-20 ENCOUNTER — Other Ambulatory Visit (HOSPITAL_COMMUNITY): Payer: BC Managed Care – PPO

## 2020-07-20 ENCOUNTER — Other Ambulatory Visit: Payer: Self-pay | Admitting: Surgery

## 2020-07-20 ENCOUNTER — Ambulatory Visit
Admission: RE | Admit: 2020-07-20 | Discharge: 2020-07-20 | Disposition: A | Discharge status: Home / Self Care | Payer: BC Managed Care – PPO | Source: Ambulatory Visit | Attending: Surgery | Admitting: Surgery

## 2020-07-20 ENCOUNTER — Telehealth (HOSPITAL_COMMUNITY): Payer: Self-pay | Admitting: *Deleted

## 2020-07-20 DIAGNOSIS — R109 Unspecified abdominal pain: Secondary | ICD-10-CM

## 2020-07-20 NOTE — Telephone Encounter (Signed)
Left message on voicemail per DPR in reference to upcoming appointment scheduled on 07/23/20 at 8:15 with detailed instructions given per Myocardial Perfusion Study Information Sheet for the test. LM to arrive 15 minutes early, and that it is imperative to arrive on time for appointment to keep from having the test rescheduled. If you need to cancel or reschedule your appointment, please call the office within 24 hours of your appointment. Failure to do so may result in a cancellation of your appointment, and a $50 no show fee. Phone number given for call back for any questions.

## 2020-07-23 ENCOUNTER — Ambulatory Visit (HOSPITAL_BASED_OUTPATIENT_CLINIC_OR_DEPARTMENT_OTHER): Payer: BC Managed Care – PPO

## 2020-07-23 ENCOUNTER — Other Ambulatory Visit: Payer: Self-pay

## 2020-07-23 ENCOUNTER — Ambulatory Visit (HOSPITAL_COMMUNITY): Payer: BC Managed Care – PPO | Attending: Cardiology

## 2020-07-23 DIAGNOSIS — I200 Unstable angina: Secondary | ICD-10-CM | POA: Insufficient documentation

## 2020-07-23 DIAGNOSIS — R000 Tachycardia, unspecified: Secondary | ICD-10-CM | POA: Diagnosis not present

## 2020-07-23 DIAGNOSIS — R002 Palpitations: Secondary | ICD-10-CM | POA: Insufficient documentation

## 2020-07-23 LAB — ECHOCARDIOGRAM COMPLETE
Area-P 1/2: 3.63 cm2
Height: 72 in
S' Lateral: 2.9 cm
Weight: 4320 oz

## 2020-07-23 MED ORDER — TECHNETIUM TC 99M TETROFOSMIN IV KIT
30.4000 | PACK | Freq: Once | INTRAVENOUS | Status: AC | PRN
  Administered 2020-07-23: 30.4 via INTRAVENOUS
  Filled 2020-07-23: qty 31

## 2020-07-24 ENCOUNTER — Ambulatory Visit (HOSPITAL_COMMUNITY): Payer: BC Managed Care – PPO | Attending: Cardiology

## 2020-07-24 LAB — MYOCARDIAL PERFUSION IMAGING
Estimated workload: 7 METS
Exercise duration (min): 6 min
Exercise duration (sec): 0 s
LV dias vol: 88 mL (ref 46–106)
LV sys vol: 36 mL
MPHR: 176 {beats}/min
Peak HR: 164 {beats}/min
Percent HR: 93 %
Rest HR: 83 {beats}/min
SDS: 2
SRS: 0
SSS: 2
TID: 0.74

## 2020-07-24 MED ORDER — TECHNETIUM TC 99M TETROFOSMIN IV KIT
32.4000 | PACK | Freq: Once | INTRAVENOUS | Status: AC | PRN
  Administered 2020-07-24: 32.4 via INTRAVENOUS
  Filled 2020-07-24: qty 33

## 2020-07-25 DIAGNOSIS — M5136 Other intervertebral disc degeneration, lumbar region: Secondary | ICD-10-CM | POA: Insufficient documentation

## 2020-07-25 DIAGNOSIS — M51369 Other intervertebral disc degeneration, lumbar region without mention of lumbar back pain or lower extremity pain: Secondary | ICD-10-CM | POA: Insufficient documentation

## 2020-08-12 ENCOUNTER — Encounter: Payer: Self-pay | Admitting: Internal Medicine

## 2020-11-18 LAB — RESULTS CONSOLE HPV: CHL HPV: NEGATIVE

## 2021-09-08 ENCOUNTER — Encounter: Payer: Self-pay | Admitting: Gastroenterology

## 2021-10-26 ENCOUNTER — Ambulatory Visit: Payer: BC Managed Care – PPO | Admitting: Gastroenterology

## 2021-11-03 ENCOUNTER — Encounter: Payer: Self-pay | Admitting: Internal Medicine

## 2021-11-25 NOTE — Progress Notes (Signed)
11/26/2021 [name] [id number] [birth year]  Referring provider: Denita Lung, MD Primary GI doctor: Dr. Cathleen Corti (Dr. Ardis Hughs)  ASSESSMENT AND PLAN:   Gastroesophageal reflux disease, unspecified whether esophagitis present Lapband APS August 2013 with post hiatus hernia repair Dysphagia, unspecified type Continue with antireflux measures and lifestyle modifications, continue on PPI emphasizing timing prior to meals, add on pepcid.  Did trial of carafate without help Schedule for EGD Will consider esophageal manometry and 24-hour pH impedance study on PPI, had positive previously Can consider gastroparesis study Will schedule EGD to evaluate for possible H. pylori, esophagitis, gastritis, peptic ulcer disease, etc.. I discussed risks of EGD with patient today, including risk of sedation, bleeding or perforation.  Patient provides understanding and gave verbal consent to proceed.  Special screening for malignant neoplasms, colon Drug-induced constipation Patient states does well with just magnesium would suggest 2 day prep Consider movantik or amitiza We have discussed the risks of bleeding, infection, perforation, medication reactions, and remote risk of death associated with colonoscopy. All questions were answered and the patient acknowledges these risk and wishes to proceed.  LUQ AB pain Possible GERD versus constipation playing a role No discomfort with AB engaged.   History of Present Illness:  46 y.o. female  with a past medical history of lap band 09/2011 status post hiatal hernia repair, GERD, dyspepsia and others listed below, returns to clinic today for evaluation of GERD and screening colonoscopy. Remote history of seeing Dr. Ardis Hughs 2012 for evaluation of reflux and dyspepsia prior to surgery with Dr. Hassell Done. Esophageal manometry unremarkable pH testing off medication showed pathological amount of acid refluxing into her esophagus 09/2011 laparoscopic  gastric banding and laparoscopic repair of hiatal hernia with Dr. Johnathan Hausen 06/2020 unremarkable myocardial perfusion, normal echo. 04/2020 CT AB and pelvis unremarkable Negative H pylori 07/20/2020 Barium swallow showed Band is empty and has been for a long time.    She has never had colonoscopy, mother with history of partial colectomy.  She has constant left upper AB pain, worse with over eating, also bad with ice cream or complete empty.  If she stops or forgets her PPI it is worse.  She was off the PPis for a long time after her procedure in 2013 with gastric banding and hernia repair, she had to restart it in 2013.  Can have dysphagia with regurgitation consistently with certain textures ie chicken.  Patient denies  nausea, vomiting, melena.   Patient has a BM every daily. On magnesium which helps.  Patient denies change in bowel habits, constipation, diarrhea, hematochezia.  Denies changes in appetite, unintentional weight loss.  Was on oxycodone, for 6-8 weeks, on belbuca and getting ketamine injections for chronic pain.   She  reports that she has never smoked. She has never used smokeless tobacco. She reports current alcohol use. She reports that she does not use drugs. Her family history includes Arthritis in her brother, maternal grandmother, mother, and paternal grandmother; Cancer in her maternal grandfather, maternal grandmother, and paternal grandmother; Depression in her mother; Diabetes in her father; Heart disease in her maternal grandmother; Hypertension in her father, maternal grandmother, and paternal grandfather; Stroke in her paternal grandfather.   Current Medications:    Current Outpatient Medications (Cardiovascular):    propranolol (INDERAL) 10 MG tablet, Take 1 tablet (10 mg total) by mouth 3 times/day as needed-between meals & bedtime.   Current Outpatient Medications (Analgesics):    BELBUCA 450 MCG FILM, Take 1 strip by mouth 2 (  two) times  daily.   Current Outpatient Medications (Other):    esomeprazole (NEXIUM) 40 MG capsule, Take 40 mg by mouth in the morning and at bedtime.   Magnesium 400 MG CAPS, Take 1 capsule by mouth daily.   Magnesium Oxide 400 MG CAPS, Take 1 capsule by mouth daily.   naloxone (NARCAN) 4 MG/0.1ML LIQD nasal spray kit,    nitrofurantoin, macrocrystal-monohydrate, (MACROBID) 100 MG capsule, Use as needed after sexual activity.   Omega-3 Fatty Acids (OMEGA 3 PO), Take 3 capsules by mouth daily.    VITAMIN D PO, Take 400 mg by mouth daily.   tiZANidine (ZANAFLEX) 4 MG tablet, Take 1 tablet by mouth as needed.   topiramate (TOPAMAX) 50 MG tablet, Take 1 tablet (50 mg total) by mouth 2 (two) times daily.  Surgical History:  She  has a past surgical history that includes Lumbar disc surgery (06/2004, 08/2010); Upper gastrointestinal endoscopy (07/01/2009); Cesarean section (03/15/04 & 04/27/07); Breath tek h pylori (08/08/2011); Laparoscopic gastric banding (10/18/2011); and Hiatal hernia repair (10/18/2011).  Current Medications, Allergies, Past Medical History, Past Surgical History, Family History and Social History were reviewed in [employer] record.  Physical Exam: BP 128/82   Pulse 67   Ht 6' (1.829 m)   Wt 288 lb 12.8 oz (131 kg)   BMI 39.17 kg/m  General:   Pleasant, well developed female in no acute distress Heart : Regular rate and rhythm; no murmurs Pulm: Clear anteriorly; no wheezing Abdomen:  Soft, Obese AB, Active bowel sounds. mild tenderness in the LUQ. Without guarding and Without rebound, without pain with abs engaged, No organomegaly appreciated. Rectal: Not evaluated Extremities:  without  edema. Neurologic:  Alert and  oriented x4;  No focal deficits.  Psych:  Cooperative. Normal mood and affect.   Vladimir Crofts, PA-C 11/26/21

## 2021-11-26 ENCOUNTER — Ambulatory Visit: Payer: BC Managed Care – PPO | Admitting: Physician Assistant

## 2021-11-26 ENCOUNTER — Encounter: Payer: Self-pay | Admitting: Physician Assistant

## 2021-11-26 VITALS — BP 128/82 | HR 67 | Ht 72.0 in | Wt 288.8 lb

## 2021-11-26 DIAGNOSIS — K5903 Drug induced constipation: Secondary | ICD-10-CM

## 2021-11-26 DIAGNOSIS — R1310 Dysphagia, unspecified: Secondary | ICD-10-CM

## 2021-11-26 DIAGNOSIS — Z9884 Bariatric surgery status: Secondary | ICD-10-CM | POA: Diagnosis not present

## 2021-11-26 DIAGNOSIS — Z1211 Encounter for screening for malignant neoplasm of colon: Secondary | ICD-10-CM

## 2021-11-26 DIAGNOSIS — K219 Gastro-esophageal reflux disease without esophagitis: Secondary | ICD-10-CM | POA: Diagnosis not present

## 2021-11-26 NOTE — Patient Instructions (Addendum)
_______________________________________________________  If you are age 46 or older, your body mass index should be between 23-30. Your Body mass index is 39.17 kg/m. If this is out of the aforementioned range listed, please consider follow up with your Primary Care Provider.  If you are age 56 or younger, your body mass index should be between 19-25. Your Body mass index is 39.17 kg/m. If this is out of the aformentioned range listed, please consider follow up with your Primary Care Provider.   ________________________________________________________  The [facility] providers would like to encourage you to use [facility] to communicate with providers for non-urgent requests or questions.  Due to long hold times on the telephone, sending your provider a message by [facility] may be a faster and more efficient way to get a response.  Please allow 48 business hours for a response.  Please remember that this is for non-urgent requests.  _______________________________________________________   Bonita Quin have been scheduled for an endoscopy and colonoscopy. Please follow the written instructions given to you at your visit today. Please pick up your prep supplies at the pharmacy within the next 1-3 days. If you use inhalers (even only as needed), please bring them with you on the day of your procedure.   Due to recent changes in healthcare laws, you may see the results of your imaging and laboratory studies on MyChart before your provider has had a chance to review them.  We understand that in some cases there may be results that are confusing or concerning to you. Not all laboratory results come back in the same time frame and the provider may be waiting for multiple results in order to interpret others.  Please give [place] 48 hours in order for your provider to thoroughly review all the results before contacting the office for clarification of your results.     Recommend starting on a fiber supplement, can try  metamucil first but if this causes gas/bloating switch to benefiber or citracel, these do not cause gas.  Take with fiber with with a full 8 oz glass of water once a day. This can take 1 month to start helping, so try for at least one month.  Recommend increasing water and physical activity.   Can consider movantik or amitiza for constipation.    - Drink at least 64-80 ounces of water/liquid per day. - Establish a time to try to move your bowels every day.  For many people, this is after a cup of coffee or after a meal such as breakfast. - Sit all of the way back on the toilet keeping your back fairly straight and while sitting up, try to rest the tops of your forearms on your upper thighs.   - Raising your feet with a step stool/squatty potty can be helpful to improve the angle that allows your stool to pass through the rectum. - Relax the rectum feeling it bulge toward the toilet water.  If you feel your rectum raising toward your body, you are contracting rather than relaxing. - Breathe in and slowly exhale. "Belly breath" by expanding your belly towards your belly button. Keep belly expanded as you gently direct pressure down and back to the anus.  A low pitched GRRR sound can assist with increasing intra-abdominal pressure.  - Repeat 3-4 times. If unsuccessful, contract the pelvic floor to restore normal tone and get off the toilet.  Avoid excessive straining. - To reduce excessive wiping by teaching your anus to normally contract, place hands on outer  aspect of knees and resist knee movement outward.  Hold 5-10 second then place hands just inside of knees and resist inward movement of knees.  Hold 5 seconds.  Repeat a few times each way.  Go to the ER if unable to pass gas, severe AB pain, unable to hold down food, any shortness of breath of chest pain.  Can also try FDGard can try that.   Please take your proton pump inhibitor medication, Nexium 40 mg BID  Can try adding pepcid at  night Please take this medication 30 minutes to 1 hour before meals- this makes it more effective.  Avoid spicy and acidic foods Avoid fatty foods Limit your intake of coffee, tea, alcohol, and carbonated drinks Work to maintain a healthy weight Keep the head of the bed elevated at least 3 inches with blocks or a wedge pillow if you are having any nighttime symptoms Stay upright for 2 hours after eating Avoid meals and snacks three to four hours before bedtime

## 2021-11-26 NOTE — Progress Notes (Signed)
Agree with the assessment and plan as outlined by Amanda Collier, PA-C. ? ?Ellawyn Wogan, DO, FACG ? ?

## 2021-12-07 ENCOUNTER — Encounter: Payer: Self-pay | Admitting: Internal Medicine

## 2021-12-18 ENCOUNTER — Encounter: Payer: Self-pay | Admitting: Certified Registered Nurse Anesthetist

## 2021-12-20 ENCOUNTER — Encounter: Payer: Self-pay | Admitting: Internal Medicine

## 2021-12-27 ENCOUNTER — Ambulatory Visit (AMBULATORY_SURGERY_CENTER): Payer: BC Managed Care – PPO | Admitting: Gastroenterology

## 2021-12-27 ENCOUNTER — Encounter: Payer: Self-pay | Admitting: Gastroenterology

## 2021-12-27 VITALS — BP 143/84 | HR 70 | Temp 98.4°F | Resp 12 | Ht 72.0 in | Wt 288.0 lb

## 2021-12-27 DIAGNOSIS — R12 Heartburn: Secondary | ICD-10-CM | POA: Diagnosis not present

## 2021-12-27 DIAGNOSIS — R1012 Left upper quadrant pain: Secondary | ICD-10-CM

## 2021-12-27 DIAGNOSIS — K317 Polyp of stomach and duodenum: Secondary | ICD-10-CM

## 2021-12-27 DIAGNOSIS — R1310 Dysphagia, unspecified: Secondary | ICD-10-CM | POA: Diagnosis not present

## 2021-12-27 DIAGNOSIS — K219 Gastro-esophageal reflux disease without esophagitis: Secondary | ICD-10-CM

## 2021-12-27 DIAGNOSIS — Z1211 Encounter for screening for malignant neoplasm of colon: Secondary | ICD-10-CM

## 2021-12-27 DIAGNOSIS — K2950 Unspecified chronic gastritis without bleeding: Secondary | ICD-10-CM

## 2021-12-27 DIAGNOSIS — Z9884 Bariatric surgery status: Secondary | ICD-10-CM

## 2021-12-27 DIAGNOSIS — K5903 Drug induced constipation: Secondary | ICD-10-CM

## 2021-12-27 HISTORY — PX: COLONOSCOPY WITH ESOPHAGOGASTRODUODENOSCOPY (EGD): SHX5779

## 2021-12-27 MED ORDER — SODIUM CHLORIDE 0.9 % IV SOLN: 500.0000 mL | INTRAVENOUS | Status: DC

## 2021-12-27 NOTE — Op Note (Signed)
[facility] Patient Name: [name] Procedure Date: 12/27/2021 1:04 PM MRN: [id number] Endoscopist: Doristine Locks , MD, [id number] Age: 46 Referring MD:  Date of Birth: [birth year] Gender: Female Account #: [id number] Procedure:                Upper GI endoscopy Indications:              Epigastric abdominal pain, Abdominal pain in the                            left upper quadrant, Heartburn, Suspected                            esophageal reflux, Regurgitation                           Hx of lap band and hiatal hernia repair 2013. Had                            done well from a reflux standpoint, but now back to                            daily reflux symptoms, requiring Nexium BID. Also                            with LUQ and MEG pain. Medicines:                Monitored Anesthesia Care Procedure:                Pre-Anesthesia Assessment:                           - Prior to the procedure, a History and Physical                            was performed, and patient medications and                            allergies were reviewed. The patient's tolerance of                            previous anesthesia was also reviewed. The risks                            and benefits of the procedure and the sedation                            options and risks were discussed with the patient.                            All questions were answered, and informed consent                            was obtained. Prior Anticoagulants: The patient has  taken no anticoagulant or antiplatelet agents. ASA                            Grade Assessment: II - A patient with mild systemic                            disease. After reviewing the risks and benefits,                            the patient was deemed in satisfactory condition to                            undergo the procedure.                           After obtaining informed consent, the endoscope was                             passed under direct vision. Throughout the                            procedure, the patient's blood pressure, pulse, and                            oxygen saturations were monitored continuously. The                            GIF [id number] #[id number] was introduced through the                            mouth, and advanced to the second part of duodenum.                            The upper GI endoscopy was accomplished without                            difficulty. The patient tolerated the procedure                            well. Scope In: Scope Out: Findings:                 The examined esophagus was normal.                           The Z-line was regular and was found 40 cm from the                            incisors.                           Multiple small sessile polyps with no bleeding were                            found in the gastric fundus and in  the gastric                            body. Several of these polyps were removed with a                            cold biopsy forceps. Resection and retrieval were                            complete. Estimated blood loss was minimal.                           The gastroesophageal flap valve was visualized                            endoscopically and classified as Hill Grade I                            (prominent fold, tight to endoscope). The GEJ was                            prominent and possibly reflecting proximal position                            of lap band at the GEJ/Gastric cardia.                           Normal mucosa was found in the entire examined                            stomach. Biopsies were taken with a cold forceps                            for Helicobacter pylori testing. Estimated blood                            loss was minimal. The pylorus was patent and easily                            traversed.                           The examined duodenum was  normal. Complications:            No immediate complications. Estimated Blood Loss:     Estimated blood loss was minimal. Impression:               - Normal esophagus.                           - Z-line regular, 40 cm from the incisors.                           - Multiple gastric polyps. Resected and retrieved.                           -  Gastroesophageal flap valve classified as Hill                            Grade I (prominent fold, tight to endoscope).                           - Normal mucosa was found in the entire stomach.                            Biopsied.                           - Normal examined duodenum. Recommendation:           - Patient has a contact number available for                            emergencies. The signs and symptoms of potential                            delayed complications were discussed with the                            patient. Return to normal activities tomorrow.                            Written discharge instructions were provided to the                            patient.                           - Resume previous diet.                           - Continue present medications.                           - Await pathology results.                           - Recommend Esophageal Manometry and pH/impedance                            monitoring (off PPI x5 days) to be scheduled.                           - Colonoscopy today. Gerrit Heck, MD 12/27/2021 1:42:21 PM

## 2021-12-27 NOTE — Progress Notes (Signed)
Pt's states no medical or surgical changes since previsit or office visit. 

## 2021-12-27 NOTE — Op Note (Signed)
[place] Patient Name: Caril Linge Procedure Date: 12/27/2021 1:04 PM MRN: [id number] Endoscopist: Gerrit Heck , MD, [id number] Age: 46 Referring MD:  Date of Birth: 10-15-1975 Gender: Female Account #: [id number] Procedure:                Colonoscopy Indications:              Screening for colorectal malignant neoplasm, This                            is the patient's first colonoscopy Medicines:                Monitored Anesthesia Care Procedure:                Pre-Anesthesia Assessment:                           - Prior to the procedure, a History and Physical                            was performed, and patient medications and                            allergies were reviewed. The patient's tolerance of                            previous anesthesia was also reviewed. The risks                            and benefits of the procedure and the sedation                            options and risks were discussed with the patient.                            All questions were answered, and informed consent                            was obtained. Prior Anticoagulants: The patient has                            taken no anticoagulant or antiplatelet agents. ASA                            Grade Assessment: II - A patient with mild systemic                            disease. After reviewing the risks and benefits,                            the patient was deemed in satisfactory condition to                            undergo the procedure.  After obtaining informed consent, the colonoscope                            was passed under direct vision. Throughout the                            procedure, the patient's blood pressure, pulse, and                            oxygen saturations were monitored continuously. The                            Colonoscope was introduced through the anus and                            advanced to the the  terminal ileum. The colonoscopy                            was performed without difficulty. The patient                            tolerated the procedure well. The quality of the                            bowel preparation was good. The terminal ileum,                            ileocecal valve, appendiceal orifice, and rectum                            were photographed. Scope In: 1:16:33 PM Scope Out: 1:26:45 PM Scope Withdrawal Time: 0 hours 7 minutes 59 seconds  Total Procedure Duration: 0 hours 10 minutes 12 seconds  Findings:                 The perianal and digital rectal examinations were                            normal.                           The entire colon appeared normal.                           Non-bleeding internal hemorrhoids were found during                            retroflexion. The hemorrhoids were small.                           The terminal ileum appeared normal. Complications:            No immediate complications. Estimated Blood Loss:     Estimated blood loss: none. Impression:               - The entire examined colon is normal.                           -  Non-bleeding internal hemorrhoids.                           - The examined portion of the ileum was normal.                           - No specimens collected. Recommendation:           - Patient has a contact number available for                            emergencies. The signs and symptoms of potential                            delayed complications were discussed with the                            patient. Return to normal activities tomorrow.                            Written discharge instructions were provided to the                            patient.                           - Resume previous diet.                           - Continue present medications.                           - Repeat colonoscopy in 10 years for screening                            purposes.                            - Return to GI office PRN. Gerrit Heck, MD 12/27/2021 1:44:40 PM

## 2021-12-27 NOTE — Progress Notes (Signed)
GASTROENTEROLOGY PROCEDURE H&P NOTE   Primary Care Physician: Denita Lung, MD    Reason for Procedure:  GERD, LUQ pain, colon cancer screening, dyspepsia, regurgitation, dysphagia  Plan:    EGD, colonoscopy  Patient is appropriate for endoscopic procedure(s) in the ambulatory ([facility]) setting.  The nature of the procedure, as well as the risks, benefits, and alternatives were carefully and thoroughly reviewed with the patient. Ample time for discussion and questions allowed. The patient understood, was satisfied, and agreed to proceed.     HPI: [name] is a 46 y.o. female who presents for EGD to evaluate for LUQ pain, dyspepsia, GERD, dysphagia, regurgitation.  History of laparoscopic gastric band and HHR in 09/2011.  Additionally presents for colonoscopy for CRC screening.  Past Medical History:  Diagnosis Date   Allergy    Arthritis    Chronic lower back pain    SCIATIC PAIN   GERD (gastroesophageal reflux disease)    Headache(784.0)    Joint pain    Morbid obesity (HCC)    Plantar fasciitis    Sinus congestion    Tachycardia     Past Surgical History:  Procedure Laterality Date   BREATH TEK H PYLORI  08/08/2011   Procedure: BREATH TEK H PYLORI;  Surgeon: Pedro Earls, MD;  Location: [facility];  Service: General;  Laterality: N/A;  [place]  03/15/04 & 04/27/07   COLONOSCOPY WITH ESOPHAGOGASTRODUODENOSCOPY (EGD)  12/27/2021   HIATAL HERNIA REPAIR  10/18/2011   Procedure: LAPAROSCOPIC REPAIR OF HIATAL HERNIA;  Surgeon: Pedro Earls, MD;  Location: [facility];  Service: General;  Laterality: N/A;   LAPAROSCOPIC GASTRIC BANDING  10/18/2011   Procedure: LAPAROSCOPIC GASTRIC BANDING;  Surgeon: Pedro Earls, MD;  Location: [facility];  Service: General;  Laterality: N/A;   [place] [facility]  06/2004, 08/2010   L5-S, laminectomy/disection   UPPER GASTROINTESTINAL ENDOSCOPY  07/01/2009   JACOBS MD    Prior to Admission medications    Medication Sig Start Date End Date Taking? Authorizing Provider  BELBUCA 600 MCG FILM SMARTSIG:1 Strip(s) buccal Twice Daily PRN 11/13/21  Yes [provider]  esomeprazole (NEXIUM) 40 MG capsule Take 40 mg by mouth in the morning and at bedtime.   Yes [provider]  Magnesium 400 MG CAPS Take 1 capsule by mouth daily.   Yes [provider]  Omega-3 Fatty Acids (OMEGA 3 PO) Take 3 capsules by mouth daily.    Yes [provider]  VITAMIN D PO Take 400 mg by mouth daily.   Yes [provider]  naloxone (NARCAN) 4 MG/0.1ML LIQD nasal spray kit     [provider]  nitrofurantoin, macrocrystal-monohydrate, (MACROBID) 100 MG capsule Use as needed after sexual activity. 02/14/20   Denita Lung, MD  gabapentin (NEURONTIN) 300 MG capsule Take 300 mg by mouth 5 (five) times daily.    05/18/11  [provider]    Current Outpatient Medications  Medication Sig Dispense Refill   BELBUCA 600 MCG FILM SMARTSIG:1 Strip(s) buccal Twice Daily PRN     esomeprazole (NEXIUM) 40 MG capsule Take 40 mg by mouth in the morning and at bedtime.     Magnesium 400 MG CAPS Take 1 capsule by mouth daily.     Omega-3 Fatty Acids (OMEGA 3 PO) Take 3 capsules by mouth daily.      VITAMIN D PO Take 400 mg by mouth daily.     naloxone (NARCAN) 4  MG/0.1ML LIQD nasal spray kit      nitrofurantoin, macrocrystal-monohydrate, (MACROBID) 100 MG capsule Use as needed after sexual activity. 20 capsule 3   Current Facility-Administered Medications  Medication Dose Route Frequency Provider Last Rate Last Admin   0.9 %  sodium chloride infusion  500 mL Intravenous Continuous Cirigliano, Vito V, DO        Allergies as of 12/27/2021 - Review Complete 12/27/2021  Allergen Reaction Noted   Fluticasone Other (See Comments) 08/20/2010   Nsaids Nausea And Vomiting and Other (See Comments) 07/29/2015   Penicillins  10/02/2019    Family History  Problem Relation Age of  Onset   Depression Mother    Arthritis Mother    Diabetes Father    Hypertension Father    Arthritis Brother    Arthritis Maternal Grandmother    Cancer Maternal Grandmother        cervical   Heart disease Maternal Grandmother    Hypertension Maternal Grandmother    Cancer Maternal Grandfather    Arthritis Paternal Grandmother    Cancer Paternal Grandmother    Hypertension Paternal Grandfather    Stroke Paternal Grandfather     Social History   Socioeconomic History   Marital status: Married    Spouse name: [name]   Number of children: 2   Years of education: Not on file   Highest education level: Not on file  Occupational History   Occupation: veternarian  Tobacco Use   Smoking status: Never   Smokeless tobacco: Never  Vaping Use   Vaping Use: Never used  Substance and Sexual Activity   Alcohol use: Yes    Comment: OCCASSIONALLY   Drug use: No   Sexual activity: Yes    Comment: husband vasectemy  Other Topics Concern   Not on file  Social History Narrative   Lives with spouse   Social Determinants of Health   Financial Resource Strain: Not on file  Food Insecurity: Not on file  Transportation Needs: Not on file  Physical Activity: Not on file  Stress: Not on file  Social Connections: Not on file  Intimate Partner Violence: Not on file    Physical Exam: Vital signs in last 24 hours: _0  (!) 154/78   Pulse 88   Temp 98.4 F (36.9 C) (Temporal)   Ht 6' (1.829 m)   Wt 288 lb (130.6 kg)   LMP 12/22/2021 Comment: husband has vasectemy  SpO2 100%   BMI 39.06 kg/m  GEN: NAD EYE: Sclerae anicteric ENT: MMM CV: Non-tachycardic Pulm: CTA b/l GI: Soft, NT/ND NEURO:  Alert & Oriented x 3   Gerrit Heck, DO [facility]   12/27/2021 12:49 PM

## 2021-12-27 NOTE — Progress Notes (Signed)
1301 Robinul 0.1 mg IV given due large amount of secretions upon assessment.  MD made aware, vss

## 2021-12-27 NOTE — Progress Notes (Signed)
Called to room to assist during endoscopic procedure.  Patient ID and intended procedure confirmed with present staff. Received instructions for my participation in the procedure from the performing physician.  

## 2021-12-27 NOTE — Progress Notes (Signed)
Report given to PACU, vss 

## 2021-12-27 NOTE — Patient Instructions (Signed)
Please read handouts provided. Continue present medications. Repeat colonoscopy in 10 years for screening. Return to GI office as needed.   YOU HAD AN ENDOSCOPIC PROCEDURE TODAY AT [facility]:   Refer to the procedure report that was given to you for any specific questions about what was found during the examination.  If the procedure report does not answer your questions, please call your gastroenterologist to clarify.  If you requested that your care partner not be given the details of your procedure findings, then the procedure report has been included in a sealed envelope for you to review at your convenience later.  YOU SHOULD EXPECT: Some feelings of bloating in the abdomen. Passage of more gas than usual.  Walking can help get rid of the air that was put into your GI tract during the procedure and reduce the bloating. If you had a lower endoscopy (such as a colonoscopy or flexible sigmoidoscopy) you may notice spotting of blood in your stool or on the toilet paper. If you underwent a bowel prep for your procedure, you may not have a normal bowel movement for a few days.  Please Note:  You might notice some irritation and congestion in your nose or some drainage.  This is from the oxygen used during your procedure.  There is no need for concern and it should clear up in a day or so.  SYMPTOMS TO REPORT IMMEDIATELY:  Following lower endoscopy (colonoscopy or flexible sigmoidoscopy):  Excessive amounts of blood in the stool  Significant tenderness or worsening of abdominal pains  Swelling of the abdomen that is new, acute  Fever of 100F or higher  Following upper endoscopy (EGD)  Vomiting of blood or coffee ground material  New chest pain or pain under the shoulder blades  Painful or persistently difficult swallowing  New shortness of breath  Fever of 100F or higher  Black, tarry-looking stools  For urgent or emergent issues, a gastroenterologist can be reached at  any hour by calling [phone]. Do not use MyChart messaging for urgent concerns.    DIET:  We do recommend a small meal at first, but then you may proceed to your regular diet.  Drink plenty of fluids but you should avoid alcoholic beverages for 24 hours.  ACTIVITY:  You should plan to take it easy for the rest of today and you should NOT DRIVE or use heavy machinery until tomorrow (because of the sedation medicines used during the test).    FOLLOW UP: Our staff will call the number listed on your records the next business day following your procedure.  We will call around 7:15- 8:00 am to check on you and address any questions or concerns that you may have regarding the information given to you following your procedure. If we do not reach you, we will leave a message.     If any biopsies were taken you will be contacted by phone or by letter within the next 1-3 weeks.  Please call us at [phone] if you have not heard about the biopsies in 3 weeks.    SIGNATURES/CONFIDENTIALITY: You and/or your care partner have signed paperwork which will be entered into your electronic medical record.  These signatures attest to the fact that that the information above on your After Visit Summary has been reviewed and is understood.  Full responsibility of the confidentiality of this discharge information lies with you and/or your care-partner.

## 2021-12-28 ENCOUNTER — Telehealth: Payer: Self-pay

## 2021-12-28 NOTE — Telephone Encounter (Signed)
  Follow up Call-     12/27/2021   12:35 PM  Call back number  Post procedure Call Back phone  # [phone]  Permission to leave phone message Yes     Patient questions:  Do you have a fever, pain , or abdominal swelling? No. Pain Score  0 *  Have you tolerated food without any problems? Yes.    Have you been able to return to your normal activities? Yes.    Do you have any questions about your discharge instructions: Diet   No. Medications  No. Follow up visit  No.  Do you have questions or concerns about your Care? No.  Actions: * If pain score is 4 or above: No action needed, pain <4.

## 2021-12-30 ENCOUNTER — Other Ambulatory Visit: Payer: Self-pay

## 2021-12-30 ENCOUNTER — Telehealth: Payer: Self-pay

## 2021-12-30 DIAGNOSIS — R1310 Dysphagia, unspecified: Secondary | ICD-10-CM

## 2021-12-30 DIAGNOSIS — K219 Gastro-esophageal reflux disease without esophagitis: Secondary | ICD-10-CM

## 2021-12-30 NOTE — Telephone Encounter (Signed)
Pt scheduled for EM with pH impedance on 05/24/21 at 12:30 pm as requested on 10/30 upper endoscopy. Ambulatory referral placed. Instructions sent to [facility] and mailed. Called pt to let her know date and time and pt verbalized understanding.

## 2021-12-31 ENCOUNTER — Encounter: Payer: Self-pay | Admitting: Gastroenterology

## 2022-03-14 ENCOUNTER — Encounter: Payer: Self-pay | Admitting: Internal Medicine

## 2022-05-25 ENCOUNTER — Ambulatory Visit (HOSPITAL_COMMUNITY)
Admission: RE | Admit: 2022-05-25 | Discharge: 2022-05-25 | Disposition: A | Discharge status: Home / Self Care | Payer: BC Managed Care – PPO | Attending: Gastroenterology | Admitting: Gastroenterology

## 2022-05-25 ENCOUNTER — Encounter (HOSPITAL_COMMUNITY)
Admission: RE | Disposition: A | Discharge status: Home / Self Care | Payer: Self-pay | Source: Home / Self Care | Attending: Gastroenterology

## 2022-05-25 DIAGNOSIS — R109 Unspecified abdominal pain: Secondary | ICD-10-CM | POA: Diagnosis not present

## 2022-05-25 DIAGNOSIS — K219 Gastro-esophageal reflux disease without esophagitis: Secondary | ICD-10-CM | POA: Diagnosis not present

## 2022-05-25 DIAGNOSIS — R12 Heartburn: Secondary | ICD-10-CM | POA: Diagnosis not present

## 2022-05-25 DIAGNOSIS — R1310 Dysphagia, unspecified: Secondary | ICD-10-CM

## 2022-05-25 HISTORY — PX: ESOPHAGEAL MANOMETRY: SHX5429

## 2022-05-25 HISTORY — PX: PH IMPEDANCE STUDY: SHX5565

## 2022-05-25 SURGERY — MANOMETRY, ESOPHAGUS
Anesthesia: Choice

## 2022-05-25 MED ORDER — LIDOCAINE VISCOUS HCL 2 % MT SOLN
OROMUCOSAL | Status: AC
  Filled 2022-05-25: qty 15

## 2022-05-25 SURGICAL SUPPLY — 2 items
FACESHIELD LNG OPTICON STERILE (SAFETY) IMPLANT
GLOVE BIO SURGEON STRL SZ8 (GLOVE) ×4 IMPLANT

## 2022-05-25 NOTE — Progress Notes (Signed)
Esophageal Manometry done per protocol. Pt tolerated well with out complication. Ph with impedance done per protocol. Pt tolerated well. Instructions given regarding the study and monitor. Pt verbalized understand and return demonstrated use of monitor. Pt will return tomorrow to have probe removed and monitor downloaded.  

## 2022-05-26 ENCOUNTER — Encounter (HOSPITAL_COMMUNITY): Payer: Self-pay | Admitting: Gastroenterology

## 2022-06-24 ENCOUNTER — Telehealth: Payer: Self-pay | Admitting: Gastroenterology

## 2022-06-24 NOTE — Telephone Encounter (Signed)
Patient is calling to go over results from her procedure and test from this past month 04/2022. Please advise, thank you.

## 2022-06-24 NOTE — Telephone Encounter (Signed)
Done

## 2022-06-24 NOTE — Telephone Encounter (Signed)
Beth, can you please give [name] copy of the report so she can inform the patient results. She has no GERD but esophageal dysmotility with incomplete relaxation of lower esophageal sphincter.Thanks

## 2022-06-28 DIAGNOSIS — R12 Heartburn: Secondary | ICD-10-CM

## 2022-06-28 DIAGNOSIS — R1310 Dysphagia, unspecified: Secondary | ICD-10-CM

## 2022-06-30 NOTE — Telephone Encounter (Signed)
Results reviewed.  Please see separate results note outlining study findings and recommendations.

## 2022-07-23 ENCOUNTER — Encounter: Payer: Self-pay | Admitting: Gastroenterology

## 2023-03-13 ENCOUNTER — Ambulatory Visit: Payer: 59 | Admitting: Family Medicine

## 2023-03-13 ENCOUNTER — Encounter: Payer: Self-pay | Admitting: Family Medicine

## 2023-03-13 VITALS — BP 120/78 | HR 76 | Ht 73.0 in | Wt 264.8 lb

## 2023-03-13 DIAGNOSIS — H6692 Otitis media, unspecified, left ear: Secondary | ICD-10-CM

## 2023-03-13 MED ORDER — AZITHROMYCIN 250 MG PO TABS
ORAL_TABLET | ORAL | 0 refills | Status: AC
Start: 2023-03-13 — End: 2023-03-18

## 2023-03-13 MED ORDER — FLUCONAZOLE 150 MG PO TABS: ORAL_TABLET | ORAL | 0 refills | Status: AC

## 2023-03-13 NOTE — Patient Instructions (Addendum)
  Contact us  on day 5 (last day of antibiotic) if you are zero better or worse, for change in antibiotic. If you are somewhat better, be patient, and contact us  on day 10 if you aren't completely better. Please be detailed in your message if you aren't getting better.  Given the nasal congestion and sinus pressure, I recommend restarting the nasonex , consider using mucinex, and you can use Afrin nasal spray if needed for ear pain (since you can't tolerate sudafed). Don't use it for more than 2 days

## 2023-03-13 NOTE — Progress Notes (Signed)
 Chief Complaint  Patient presents with   Ear Pain    Left ear pain since 03/02/23. Had a viral infection that cleared up but ear is still bothering her. She did fly to [place].    Traveled to [place] over New Year's.  While in [place] she got sick with a cold.  She has some mild residual congestion.   L ear pain started on the 2nd day of symptoms (1/3), and pain has persisted.  Did some ear saline irrigations to the left ear, which eased the pain some. She has been taking Nyquil at night--helps her sleep, discomfort not noticeable at night.  Drainage from L nares, usually clear, only occasionally yellow. Denies any pain on the right side.   PMH, PSH, SH reviewed  Works as a Photographer Medications as of 03/13/2023  Medication Sig Note   BELBUCA 600 MCG FILM SMARTSIG:1 Strip(s) buccal Twice Daily PRN 03/13/2023: buprenorphine   Berberine Chloride (BERBERINE HCI PO) Take 1 tablet by mouth daily.    Magnesium  400 MG CAPS Take 1 capsule by mouth daily.    Misc Natural Products (JOINT HEALTH) CAPS Take 2 capsules by mouth daily. 03/13/2023: Glucosamine, Chondroitin and MSM   NONFORMULARY OR COMPOUNDED ITEM Take 1 capsule by mouth daily. 03/13/2023: Weightloss supplement(chromium)   Omega-3 Fatty Acids (OMEGA 3 PO) Take 3 capsules by mouth daily.     VITAMIN D PO Take 400 mg by mouth daily.    naloxone (NARCAN) 4 MG/0.1ML LIQD nasal spray kit  (Patient not taking: Reported on 03/13/2023) 03/13/2023: As needed   nitrofurantoin , macrocrystal-monohydrate, (MACROBID ) 100 MG capsule Use as needed after sexual activity. (Patient not taking: Reported on 03/13/2023) 03/13/2023: As needed   [DISCONTINUED] esomeprazole  (NEXIUM ) 40 MG capsule Take 40 mg by mouth in the morning and at bedtime.    [DISCONTINUED] gabapentin (NEURONTIN) 300 MG capsule Take 300 mg by mouth 5 (five) times daily.      No facility-administered encounter medications on file as of 03/13/2023.   Also taking Nyquil the last few  nights  Allergies  Allergen Reactions   Fluticasone  Other (See Comments)    headache   Nsaids Nausea And Vomiting and Other (See Comments)   Penicillins     ROS: no fever or chills (had 99 the first couple of days of illness only). Chronic migraines and stuffiness. Slightly increased sinus pressure. Sore throat and cough pretty much resolved. No chest pain or shortness of breath. See HPI   PHYSICAL EXAM:  BP 120/78   Pulse 76   Ht 6' 1 (1.854 m)   Wt 264 lb 12.8 oz (120.1 kg)   LMP  (LMP Unknown)   BMI 34.94 kg/m   Well-appearing, pleasant female, in no distress Nasal mucosa is mildly edematous with clear mucus on the left. Mild tenderness over maxillary sinuses bilaterally R TM and EAC is normal. L TM--some erythema and prominent vessel in mid-TM. Overall TM appears more white. EAC is normal on the left. OP is clear. Neck: no lymphadenopathy or mass Heart: regular rate and rhythm Lungs: clear bilaterally   ASSESSMENT/PLAN:  Left otitis media, unspecified otitis media type - treat with zpak, and treat congestion (in case there is component of ETD contributing to ear pain). Can't tolerate sudafed; start nasonex , mucinex - Plan: azithromycin  (ZITHROMAX ) 250 MG tablet   Contact us  on day 5 (last day of antibiotic) if you are zero better or worse, for change in antibiotic. If you are somewhat better, be patient,  and contact us  on day 10 if you aren't completely better. Please be detailed in your message if you aren't getting better.  Given the nasal congestion and sinus pressure, I recommend restarting the nasonex , consider using mucinex, and you can use Afrin nasal spray if needed for ear pain (since you can't tolerate sudafed). Don't use it for more than 2 days

## 2023-03-17 ENCOUNTER — Encounter: Payer: Self-pay | Admitting: Family Medicine

## 2023-03-20 MED ORDER — CEFUROXIME AXETIL 500 MG PO TABS: 500.0000 mg | ORAL_TABLET | Freq: Two times a day (BID) | ORAL | 0 refills | Status: AC

## 2023-04-17 NOTE — Progress Notes (Unsigned)
CARDIOLOGY CONSULT NOTE       Patient ID: [name] MRN: [id number] DOB/AGE: [birth year] 48 y.o.  Referring Physician: Susann Givens Primary Physician: Ronnald Nian, MD Primary Cardiologist: New last seen 2021 Reason for Consultation: Tachycardia    HPI:  48 y.o. last seen in 2021. At that time had tachycardia. History of chronic lower back pain, GERD, gastric banding Triggers included COVID vaccine, GAD and muscle relaxer's. Associated lightheadedness. No ETOH, stimulants and normal thyroid. ECG normal with no pre excitation Monitor with no significant arrhythmias 11/24/19 average HR 89 bpm. Echo in 2021 and 2022 normal EF 60-65% no valve dx. Myovue 07/24/20 normal no ischemia EF 59%   Traveled to [place] over [place] and seen in ED 03/13/23 for left otitis Rx with Zithromax. Some left sided abdominal pain related to menstrual cycle.  She is seeing integrative medicine for insomnia and general malaise  Melatonin level very high and d/c supplement.   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   ROS All other systems reviewed and negative except as noted above  Past Medical History:  Diagnosis Date   Allergy    Arthritis    Chronic lower back pain    SCIATIC PAIN   GERD (gastroesophageal reflux disease)    Headache(784.0)    Joint pain    Morbid obesity (HCC)    Plantar fasciitis    Sinus congestion    Tachycardia     Family History  Problem Relation Age of Onset   Depression Mother    Arthritis Mother    Diabetes Father    Hypertension Father    Arthritis Brother    Arthritis Maternal Grandmother    Cancer Maternal Grandmother        cervical   Heart disease Maternal Grandmother    Hypertension Maternal Grandmother    Cancer Maternal Grandfather    Arthritis Paternal Grandmother    Cancer Paternal Grandmother    Hypertension Paternal Grandfather    Stroke Paternal Grandfather     Social History    Socioeconomic History   Marital status: Married    Spouse name: Kandee Keen   Number of children: 2   Years of education: Not on file   Highest education level: Not on file  Occupational History   Occupation: veternarian  Tobacco Use   Smoking status: Never   Smokeless tobacco: Never  Vaping Use   Vaping status: Never Used  Substance and Sexual Activity   Alcohol use: Yes    Comment: OCCASSIONALLY   Drug use: No   Sexual activity: Yes    Comment: husband vasectemy  Other Topics Concern   Not on file  Social History Narrative   Lives with spouse   Social Drivers of Corporate investment banker Strain: Not on file  Food Insecurity: Not on file  Transportation Needs: Not on file  Physical Activity: Not on file  Stress: Not on file  Social Connections: Unknown (07/03/2021)   Received from [facility]   Social Network    Social Network: Not on file  Intimate Partner Violence: Unknown (06/01/2021)   Received from [facility], [facility]   HITS    Physically Hurt: Not on file    Insult or Talk Down To: Not on file    Threaten Physical Harm: Not on file    Scream or Curse: Not on file    Past Surgical History:  Procedure Laterality Date   BREATH Nelda Bucks  08/08/2011   Procedure: BREATH TEK H PYLORI;  Surgeon: Valarie Merino, MD;  Location: [facility];  Service: General;  Laterality: N/A;  715   CESAREAN SECTION  03/15/04 & 04/27/07   COLONOSCOPY WITH ESOPHAGOGASTRODUODENOSCOPY (EGD)  12/27/2021   ESOPHAGEAL MANOMETRY N/A 05/25/2022   Procedure: ESOPHAGEAL MANOMETRY (EM);  Surgeon: Shellia Cleverly, DO;  Location: [facility];  Service: Gastroenterology;  Laterality: N/A;   HIATAL HERNIA REPAIR  10/18/2011   Procedure: LAPAROSCOPIC REPAIR OF HIATAL HERNIA;  Surgeon: Valarie Merino, MD;  Location: [facility];  Service: General;  Laterality: N/A;   LAPAROSCOPIC GASTRIC BANDING  10/18/2011   Procedure: LAPAROSCOPIC GASTRIC BANDING;  Surgeon: Valarie Merino, MD;  Location: [facility];  Service: General;  Laterality: N/A;   LUMBAR DISC SURGERY  06/2004, 08/2010   L5-S, laminectomy/disection   PH IMPEDANCE STUDY N/A 05/25/2022   Procedure: PH IMPEDANCE STUDY;  Surgeon: Shellia Cleverly, DO;  Location: [facility];  Service: Gastroenterology;  Laterality: N/A;   UPPER GASTROINTESTINAL ENDOSCOPY  07/01/2009   JACOBS MD      Current Outpatient Medications:    BELBUCA 600 MCG FILM, SMARTSIG:1 Strip(s) buccal Twice Daily PRN, Disp: , Rfl:    Berberine Chloride (BERBERINE HCI PO), Take 1 tablet by mouth daily., Disp: , Rfl:    cefUROXime (CEFTIN) 500 MG tablet, Take 1 tablet (500 mg total) by mouth 2 (two) times daily with a meal., Disp: 20 tablet, Rfl: 0   fluconazole (DIFLUCAN) 150 MG tablet, Take 1 tablet by mouth for yeast infection.  Repeat in 1 week if needed, Disp: 2 tablet, Rfl: 0   Magnesium 400 MG CAPS, Take 1 capsule by mouth daily., Disp: , Rfl:    Misc Natural Products (JOINT HEALTH) CAPS, Take 2 capsules by mouth daily., Disp: , Rfl:    naloxone (NARCAN) 4 MG/0.1ML LIQD nasal spray kit, , Disp: , Rfl:    nitrofurantoin, macrocrystal-monohydrate, (MACROBID) 100 MG capsule, Use as needed after sexual activity. (Patient not taking: Reported on 03/13/2023), Disp: 20 capsule, Rfl: 3   NONFORMULARY OR COMPOUNDED ITEM, Take 1 capsule by mouth daily., Disp: , Rfl:    Omega-3 Fatty Acids (OMEGA 3 PO), Take 3 capsules by mouth daily. , Disp: , Rfl:    VITAMIN D PO, Take 400 mg by mouth daily., Disp: , Rfl:     Physical Exam: There were no vitals taken for this visit.    Affect appropriate Overweight female  HEENT: normal Neck supple with no adenopathy JVP normal no bruits no thyromegaly Lungs clear with no wheezing and good diaphragmatic motion Heart:  S1/S2 no murmur, no rub, gallop or click PMI normal Abdomen: benighn, BS positve, no tenderness, no AAA no bruit.  No HSM or HJR Distal pulses intact with no bruits No edema Neuro  non-focal Skin warm and dry No muscular weakness   Labs:   Lab Results  Component Value Date   WBC 7.2 10/02/2019   HGB 11.2 10/02/2019   HCT 35.9 10/02/2019   MCV 84 10/02/2019   PLT 370 10/02/2019   No results for input(s): "NA", "K", "CL", "CO2", "BUN", "CREATININE", "CALCIUM", "PROT", "BILITOT", "ALKPHOS", "ALT", "AST", "GLUCOSE" in the last 168 hours.  Invalid input(s): "LABALBU" No results found for: "CKTOTAL", "CKMB", "CKMBINDEX", "TROPONINI"  Lab Results  Component Value Date   CHOL 178 04/09/2020   CHOL 176 06/29/2017   Lab Results  Component Value Date  HDL 71 04/09/2020   HDL 79 06/29/2017   Lab Results  Component Value Date   LDLCALC 97 04/09/2020   LDLCALC 89 06/29/2017   Lab Results  Component Value Date   TRIG 50 04/09/2020   TRIG 41 06/29/2017   Lab Results  Component Value Date   CHOLHDL 2.5 04/09/2020   CHOLHDL 2.2 06/29/2017   No results found for: "LDLDIRECT"    Radiology: No results found.  EKG: SR rate 73 normal    ASSESSMENT AND PLAN:   Tachycardia: benign normal ECG prior echo and monitor normal no need for further w/u  Gastric Banding:  with some persistent left sided abdominal pain. EGD benign normal motility F/U Dr Barron Alvine Lumbago:  no longer on narcotics f/u primary CAD risk suggested coronary calcium score   Calcium Score  F/U PRN  unless score very high   Signed: Charlton Haws 04/17/2023, 3:22 PM

## 2023-04-19 ENCOUNTER — Encounter: Payer: Self-pay | Admitting: Cardiovascular Disease

## 2023-04-19 ENCOUNTER — Ambulatory Visit: Payer: 59 | Attending: Cardiovascular Disease | Admitting: Cardiovascular Disease

## 2023-04-19 VITALS — BP 126/76 | HR 75 | Ht 73.0 in | Wt 251.2 lb

## 2023-04-19 DIAGNOSIS — I479 Paroxysmal tachycardia, unspecified: Secondary | ICD-10-CM

## 2023-04-19 DIAGNOSIS — Z9884 Bariatric surgery status: Secondary | ICD-10-CM | POA: Diagnosis not present

## 2023-04-19 DIAGNOSIS — R12 Heartburn: Secondary | ICD-10-CM

## 2023-04-19 NOTE — Patient Instructions (Addendum)
Medication Instructions:  Your physician recommends that you continue on your current medications as directed. Please refer to the Current Medication list given to you today.  *If you need a refill on your cardiac medications before your next appointment, please call your pharmacy*  Lab Work: If you have labs (blood work) drawn today and your tests are completely normal, you will receive your results only by: MyChart Message (if you have MyChart) OR A paper copy in the mail If you have any lab test that is abnormal or we need to change your treatment, we will call you to review the results.  Testing/Procedures: CT scanning for a cardiac calcium score (CAT scanning), is a noninvasive, special x-ray that produces cross-sectional images of the body using x-rays and a computer. CT scans help physicians diagnose and treat medical conditions. For some CT exams, a contrast material is used to enhance visibility in the area of the body being studied. CT scans provide greater clarity and reveal more details than regular x-ray exams.   Follow-Up: At [facility], you and your health needs are our priority.  As part of our continuing mission to provide you with exceptional heart care, we have created designated Provider Care Teams.  These Care Teams include your primary Cardiologist (physician) and Advanced Practice Providers (APPs -  Physician Assistants and Nurse Practitioners) who all work together to provide you with the care you need, when you need it.  We recommend signing up for the patient portal called "MyChart".  Sign up information is provided on this After Visit Summary.  MyChart is used to connect with patients for Virtual Visits (Telemedicine).  Patients are able to view lab/test results, encounter notes, upcoming appointments, etc.  Non-urgent messages can be sent to your provider as well.   To learn more about what you can do with MyChart, go to [email or web address].    Your next  appointment:   As needed  Provider:   Charlton Haws, MD     Other Instructions    1st Floor: - Lobby - Registration  - Pharmacy  - Lab - Cafe  2nd Floor: - PV Lab - Diagnostic Testing (echo, CT, nuclear med)  3rd Floor: - Vacant  4th Floor: - TCTS (cardiothoracic surgery) - AFib Clinic - Structural Heart Clinic - Vascular Surgery  - Vascular Ultrasound  5th Floor: - [facility] Cardiology (general and EP) - Clinical Pharmacy for coumadin, hypertension, lipid, weight-loss medications, and med management appointments    Valet parking services will be available as well.

## 2023-04-25 ENCOUNTER — Encounter: Payer: Self-pay | Admitting: Internal Medicine

## 2023-05-03 ENCOUNTER — Ambulatory Visit (HOSPITAL_COMMUNITY)
Admission: RE | Admit: 2023-05-03 | Discharge: 2023-05-03 | Disposition: A | Discharge status: Home / Self Care | Payer: Self-pay | Source: Ambulatory Visit | Attending: Cardiovascular Disease | Admitting: Cardiovascular Disease

## 2023-05-03 DIAGNOSIS — R12 Heartburn: Secondary | ICD-10-CM | POA: Insufficient documentation
